# Patient Record
Sex: Female | Born: 1935 | Race: Black or African American | Hispanic: No | Smoking: Former smoker
Health system: Southern US, Community
[De-identification: ages and names within clinical notes are randomized; demographics above are authoritative.]

## PROBLEM LIST (undated history)

## (undated) DIAGNOSIS — I2510 Atherosclerotic heart disease of native coronary artery without angina pectoris: Secondary | ICD-10-CM

## (undated) DIAGNOSIS — N289 Disorder of kidney and ureter, unspecified: Secondary | ICD-10-CM

## (undated) DIAGNOSIS — Z955 Presence of coronary angioplasty implant and graft: Secondary | ICD-10-CM

## (undated) DIAGNOSIS — F0280 Dementia in other diseases classified elsewhere without behavioral disturbance: Secondary | ICD-10-CM

## (undated) DIAGNOSIS — I10 Essential (primary) hypertension: Secondary | ICD-10-CM

## (undated) DIAGNOSIS — I509 Heart failure, unspecified: Secondary | ICD-10-CM

## (undated) DIAGNOSIS — G309 Alzheimer's disease, unspecified: Secondary | ICD-10-CM

## (undated) DIAGNOSIS — F0390 Unspecified dementia without behavioral disturbance: Secondary | ICD-10-CM

## (undated) HISTORY — PX: COLON SURGERY: SHX602

## (undated) HISTORY — PX: CORONARY ANGIOPLASTY: SHX604

---

## 2004-07-28 DIAGNOSIS — Z955 Presence of coronary angioplasty implant and graft: Secondary | ICD-10-CM

## 2004-07-28 HISTORY — DX: Presence of coronary angioplasty implant and graft: Z95.5

## 2015-03-29 ENCOUNTER — Observation Stay
Admit: 2015-03-29 | Discharge: 2015-03-29 | Disposition: A | Discharge status: Home / Self Care | Payer: Medicare PPO | Attending: Internal Medicine | Admitting: Internal Medicine

## 2015-03-29 ENCOUNTER — Inpatient Hospital Stay
Admission: EM | Admit: 2015-03-29 | Discharge: 2015-03-31 | DRG: 291 | Disposition: A | Discharge status: Home Health | Payer: Medicare PPO | Attending: Internal Medicine | Admitting: Internal Medicine

## 2015-03-29 ENCOUNTER — Emergency Department: Payer: Medicare PPO

## 2015-03-29 ENCOUNTER — Encounter: Payer: Self-pay | Admitting: Emergency Medicine

## 2015-03-29 DIAGNOSIS — J9600 Acute respiratory failure, unspecified whether with hypoxia or hypercapnia: Secondary | ICD-10-CM | POA: Diagnosis present

## 2015-03-29 DIAGNOSIS — F0280 Dementia in other diseases classified elsewhere without behavioral disturbance: Secondary | ICD-10-CM | POA: Diagnosis present

## 2015-03-29 DIAGNOSIS — I2510 Atherosclerotic heart disease of native coronary artery without angina pectoris: Secondary | ICD-10-CM | POA: Diagnosis present

## 2015-03-29 DIAGNOSIS — R778 Other specified abnormalities of plasma proteins: Secondary | ICD-10-CM

## 2015-03-29 DIAGNOSIS — R748 Abnormal levels of other serum enzymes: Secondary | ICD-10-CM | POA: Diagnosis present

## 2015-03-29 DIAGNOSIS — Z955 Presence of coronary angioplasty implant and graft: Secondary | ICD-10-CM

## 2015-03-29 DIAGNOSIS — I5033 Acute on chronic diastolic (congestive) heart failure: Secondary | ICD-10-CM | POA: Diagnosis not present

## 2015-03-29 DIAGNOSIS — J90 Pleural effusion, not elsewhere classified: Secondary | ICD-10-CM

## 2015-03-29 DIAGNOSIS — Z87891 Personal history of nicotine dependence: Secondary | ICD-10-CM

## 2015-03-29 DIAGNOSIS — I129 Hypertensive chronic kidney disease with stage 1 through stage 4 chronic kidney disease, or unspecified chronic kidney disease: Secondary | ICD-10-CM | POA: Diagnosis present

## 2015-03-29 DIAGNOSIS — Z79899 Other long term (current) drug therapy: Secondary | ICD-10-CM

## 2015-03-29 DIAGNOSIS — R7989 Other specified abnormal findings of blood chemistry: Secondary | ICD-10-CM

## 2015-03-29 DIAGNOSIS — D61818 Other pancytopenia: Secondary | ICD-10-CM | POA: Diagnosis present

## 2015-03-29 DIAGNOSIS — N183 Chronic kidney disease, stage 3 (moderate): Secondary | ICD-10-CM | POA: Diagnosis present

## 2015-03-29 DIAGNOSIS — I4891 Unspecified atrial fibrillation: Secondary | ICD-10-CM | POA: Diagnosis present

## 2015-03-29 DIAGNOSIS — I493 Ventricular premature depolarization: Secondary | ICD-10-CM | POA: Diagnosis present

## 2015-03-29 DIAGNOSIS — I509 Heart failure, unspecified: Secondary | ICD-10-CM

## 2015-03-29 DIAGNOSIS — Z7982 Long term (current) use of aspirin: Secondary | ICD-10-CM

## 2015-03-29 DIAGNOSIS — G309 Alzheimer's disease, unspecified: Secondary | ICD-10-CM | POA: Diagnosis present

## 2015-03-29 HISTORY — DX: Heart failure, unspecified: I50.9

## 2015-03-29 HISTORY — DX: Presence of coronary angioplasty implant and graft: Z95.5

## 2015-03-29 HISTORY — DX: Disorder of kidney and ureter, unspecified: N28.9

## 2015-03-29 HISTORY — DX: Essential (primary) hypertension: I10

## 2015-03-29 HISTORY — DX: Atherosclerotic heart disease of native coronary artery without angina pectoris: I25.10

## 2015-03-29 HISTORY — DX: Unspecified dementia, unspecified severity, without behavioral disturbance, psychotic disturbance, mood disturbance, and anxiety: F03.90

## 2015-03-29 HISTORY — DX: Dementia in other diseases classified elsewhere, unspecified severity, without behavioral disturbance, psychotic disturbance, mood disturbance, and anxiety: F02.80

## 2015-03-29 HISTORY — DX: Alzheimer's disease, unspecified: G30.9

## 2015-03-29 LAB — CBC WITH DIFFERENTIAL/PLATELET
Basophils Absolute: 0 10*3/uL (ref 0–0.1)
Eosinophils Absolute: 0 10*3/uL (ref 0–0.7)
Eosinophils Relative: 1 %
HEMATOCRIT: 36.9 % (ref 35.0–47.0)
HEMOGLOBIN: 11.3 g/dL — AB (ref 12.0–16.0)
LYMPHS ABS: 0.6 10*3/uL — AB (ref 1.0–3.6)
MCH: 25.8 pg — ABNORMAL LOW (ref 26.0–34.0)
MCHC: 30.5 g/dL — AB (ref 32.0–36.0)
MCV: 84.6 fL (ref 80.0–100.0)
MONO ABS: 0.6 10*3/uL (ref 0.2–0.9)
NEUTROS ABS: 2 10*3/uL (ref 1.4–6.5)
Platelets: 96 10*3/uL — ABNORMAL LOW (ref 150–440)
RBC: 4.37 MIL/uL (ref 3.80–5.20)
RDW: 17.1 % — AB (ref 11.5–14.5)
WBC: 3.3 10*3/uL — ABNORMAL LOW (ref 3.6–11.0)

## 2015-03-29 LAB — COMPREHENSIVE METABOLIC PANEL
ALBUMIN: 4 g/dL (ref 3.5–5.0)
ALT: 28 U/L (ref 14–54)
AST: 45 U/L — AB (ref 15–41)
Alkaline Phosphatase: 85 U/L (ref 38–126)
Anion gap: 9 (ref 5–15)
BUN: 29 mg/dL — AB (ref 6–20)
CHLORIDE: 110 mmol/L (ref 101–111)
CO2: 25 mmol/L (ref 22–32)
CREATININE: 1.5 mg/dL — AB (ref 0.44–1.00)
Calcium: 9.6 mg/dL (ref 8.9–10.3)
GFR calc Af Amer: 34 mL/min — ABNORMAL LOW (ref >60)
GFR, EST NON AFRICAN AMERICAN: 29 mL/min — AB (ref >60)
GLUCOSE: 91 mg/dL (ref 65–99)
POTASSIUM: 3.5 mmol/L (ref 3.5–5.1)
SODIUM: 144 mmol/L (ref 135–145)
Total Bilirubin: 1.2 mg/dL (ref 0.3–1.2)
Total Protein: 8.7 g/dL — ABNORMAL HIGH (ref 6.5–8.1)

## 2015-03-29 LAB — BRAIN NATRIURETIC PEPTIDE: B Natriuretic Peptide: 1402 pg/mL — ABNORMAL HIGH (ref 0.0–100.0)

## 2015-03-29 LAB — TROPONIN I
TROPONIN I: 0.07 ng/mL — AB (ref <0.031)
Troponin I: 0.1 ng/mL — ABNORMAL HIGH (ref <0.031)

## 2015-03-29 LAB — MRSA PCR SCREENING: MRSA by PCR: NEGATIVE

## 2015-03-29 LAB — TSH: TSH: 3.903 u[IU]/mL (ref 0.350–4.500)

## 2015-03-29 MED ORDER — METOPROLOL TARTRATE 25 MG PO TABS
25.0000 mg | ORAL_TABLET | Freq: Two times a day (BID) | ORAL | Status: DC
  Administered 2015-03-31: 25 mg via ORAL
  Filled 2015-03-29 (×2): qty 1

## 2015-03-29 MED ORDER — ASPIRIN 81 MG PO CHEW
324.0000 mg | CHEWABLE_TABLET | Freq: Once | ORAL | Status: AC
  Administered 2015-03-29: 324 mg via ORAL
  Filled 2015-03-29: qty 4

## 2015-03-29 MED ORDER — ONDANSETRON HCL 4 MG PO TABS: 4.0000 mg | ORAL_TABLET | Freq: Four times a day (QID) | ORAL | Status: DC | PRN

## 2015-03-29 MED ORDER — LOSARTAN POTASSIUM 50 MG PO TABS
50.0000 mg | ORAL_TABLET | Freq: Every day | ORAL | Status: DC
  Administered 2015-03-31: 50 mg via ORAL
  Filled 2015-03-29 (×2): qty 1

## 2015-03-29 MED ORDER — ACETAMINOPHEN 325 MG PO TABS: 650.0000 mg | ORAL_TABLET | Freq: Four times a day (QID) | ORAL | Status: DC | PRN

## 2015-03-29 MED ORDER — HEPARIN SODIUM (PORCINE) 5000 UNIT/ML IJ SOLN
5000.0000 [IU] | Freq: Three times a day (TID) | INTRAMUSCULAR | Status: DC
  Administered 2015-03-29 – 2015-03-31 (×5): 5000 [IU] via SUBCUTANEOUS
  Filled 2015-03-29 (×5): qty 1

## 2015-03-29 MED ORDER — HYDRALAZINE HCL 20 MG/ML IJ SOLN: 10.0000 mg | INTRAMUSCULAR | Status: DC | PRN

## 2015-03-29 MED ORDER — ASPIRIN EC 81 MG PO TBEC
81.0000 mg | DELAYED_RELEASE_TABLET | Freq: Every day | ORAL | Status: DC
  Administered 2015-03-30 – 2015-03-31 (×2): 81 mg via ORAL
  Filled 2015-03-29 (×2): qty 1

## 2015-03-29 MED ORDER — SODIUM CHLORIDE 0.9 % IJ SOLN
3.0000 mL | Freq: Two times a day (BID) | INTRAMUSCULAR | Status: DC
Start: 2015-03-29 — End: 2015-03-31
  Administered 2015-03-29 – 2015-03-31 (×4): 3 mL via INTRAVENOUS

## 2015-03-29 MED ORDER — FUROSEMIDE 10 MG/ML IJ SOLN
40.0000 mg | Freq: Two times a day (BID) | INTRAMUSCULAR | Status: DC
  Administered 2015-03-30 – 2015-03-31 (×3): 40 mg via INTRAVENOUS
  Filled 2015-03-29 (×3): qty 4

## 2015-03-29 MED ORDER — POLYETHYLENE GLYCOL 3350 17 G PO PACK: 17.0000 g | PACK | Freq: Every day | ORAL | Status: DC | PRN

## 2015-03-29 MED ORDER — ACETAMINOPHEN 650 MG RE SUPP: 650.0000 mg | Freq: Four times a day (QID) | RECTAL | Status: DC | PRN

## 2015-03-29 MED ORDER — ONDANSETRON HCL 4 MG/2ML IJ SOLN: 4.0000 mg | Freq: Four times a day (QID) | INTRAMUSCULAR | Status: DC | PRN

## 2015-03-29 MED ORDER — FUROSEMIDE 10 MG/ML IJ SOLN
40.0000 mg | Freq: Once | INTRAMUSCULAR | Status: AC
  Administered 2015-03-29: 40 mg via INTRAVENOUS
  Filled 2015-03-29: qty 4

## 2015-03-29 MED ORDER — SPIRONOLACTONE 25 MG PO TABS
25.0000 mg | ORAL_TABLET | Freq: Every day | ORAL | Status: DC
  Administered 2015-03-30 – 2015-03-31 (×2): 25 mg via ORAL
  Filled 2015-03-29 (×2): qty 1

## 2015-03-29 NOTE — ED Notes (Signed)
Pt lives in [facility] facility and was brought in by EMS to be evaluated for congestive heart failure (per nurse practitioner).  Daily weights have been fluctuating and patient has bilateral lower extremity edema.

## 2015-03-29 NOTE — H&P (Signed)
[facility] [place] at [facility]   PATIENT NAME: Kimberly Acosta    MR#:  [id number]  DATE OF BIRTH:  1919-08-30  DATE OF ADMISSION:  03/29/2015  PRIMARY CARE PHYSICIAN: [facility]   REQUESTING/REFERRING PHYSICIAN: Dr. Darnelle Catalan  CHIEF COMPLAINT:   Chief Complaint  Patient presents with  . Leg Swelling    HISTORY OF PRESENT ILLNESS:  Kimberly Acosta  is a 81 y.o. female with a known history of coronary artery disease, congestive heart failure, atrial fibrillation not currently on anticoagulation, dementia who presents from [facility] due to increasing lower extremity edema and weight gain not responding to oral Lasix. On emergency room evaluation she is noted to have a slightly elevated troponin at 0.07 and hospitalist service was asked to admit for further evaluation. Her son provides the history as she is unable to do so due to dementia. He reports that recently she has been short of breath, no coughing or fevers. No recent syncopal events. She has gained about 10 pounds over the past 4 months.  PAST MEDICAL HISTORY:   Past Medical History  Diagnosis Date  . CHF (congestive heart failure)   . Hypertension   . Renal disorder     Chronic Kidney Disease  . Alzheimer disease   . Dementia   . S/P coronary artery stent placement 2006  . Coronary artery disease     PCI and stent to LAD in 2005    PAST SURGICAL HISTORY:   Past Surgical History  Procedure Laterality Date  . Coronary angioplasty      SOCIAL HISTORY:   Social History  Substance Use Topics  . Smoking status: Former Smoker    Quit date: 07/28/1978  . Smokeless tobacco: Not on file  . Alcohol Use: No    FAMILY HISTORY:  No family history on file. family history is unknown and the patient has dementia  DRUG ALLERGIES:  No Known Allergies  REVIEW OF SYSTEMS:   Review of Systems  Constitutional: Negative for fever, chills, weight loss  and malaise/fatigue.  HENT: Negative for congestion and hearing loss.   Eyes: Negative for blurred vision and pain.  Respiratory: Positive for shortness of breath. Negative for cough, hemoptysis, sputum production and stridor.   Cardiovascular: Positive for leg swelling. Negative for chest pain, palpitations and orthopnea.  Gastrointestinal: Negative for nausea, vomiting, abdominal pain, diarrhea, constipation and blood in stool.  Genitourinary: Negative for dysuria and frequency.  Musculoskeletal: Negative for myalgias, back pain, joint pain and neck pain.  Skin: Negative for rash.  Neurological: Negative for focal weakness, loss of consciousness and headaches.  Endo/Heme/Allergies: Does not bruise/bleed easily.  Psychiatric/Behavioral: Negative for depression and hallucinations. The patient is not nervous/anxious.     MEDICATIONS AT HOME:   Prior to Admission medications   Medication Sig Start Date End Date Taking? Authorizing Provider  aspirin EC 81 MG tablet Take 81 mg by mouth daily.   Yes Historical Provider, MD  furosemide (LASIX) 20 MG tablet Take 20 mg by mouth.   Yes Historical Provider, MD  furosemide (LASIX) 40 MG tablet Take 40 mg by mouth daily.   Yes Historical Provider, MD  losartan (COZAAR) 50 MG tablet Take 50 mg by mouth daily.   Yes Historical Provider, MD  metoprolol tartrate (LOPRESSOR) 25 MG tablet Take 25 mg by mouth 2 (two) times daily.   Yes Historical Provider, MD  spironolactone (ALDACTONE) 25 MG tablet Take 25 mg by mouth daily.  Yes Historical Provider, MD      VITAL SIGNS:  Blood pressure 149/82, pulse 120, resp. rate 12, height  (1.6 m), weight 74 kg (163 lb 2.3 oz), SpO2 62 %.  PHYSICAL EXAMINATION:  GENERAL:  81 y.o.-year-old patient lying in the bed with no acute distress.  EYES: Pupils equal, round, reactive to light and accommodation. No scleral icterus. Extraocular muscles intact.  HEENT: Head atraumatic, normocephalic. Oropharynx and  nasopharynx clear. Mucous membranes are moist NECK:  Supple, no jugular venous distention. No thyroid enlargement, no tenderness.  LUNGS: Decreased breath sounds over the right base, bibasilar crackles, no wheezing, no rhonchi. She is not in any respiratory distress. CARDIOVASCULAR: Irregular S1, S2 normal. No murmurs, rubs, or gallops.  ABDOMEN: Soft, nontender, nondistended. Bowel sounds present. No organomegaly or mass. No guarding no rebound EXTREMITIES: 2+ pedal edema extending to the thighs bilaterally, there seems to be some cyanosis of the fingertips, no clubbing.  NEUROLOGIC: Cranial nerves II through XII are grossly intact. Muscle strength 5/5 in all extremities. Sensation intact. Gait not checked.  PSYCHIATRIC: The patient is alert and oriented to person and place. Calm  SKIN: No obvious rash, lesion, or ulcer.   LABORATORY PANEL:   CBC  Recent Labs Lab 03/29/15 1547  WBC 3.3*  HGB 11.3*  HCT 36.9  PLT 96*   ------------------------------------------------------------------------------------------------------------------  Chemistries   Recent Labs Lab 03/29/15 1547  NA 144  K 3.5  CL 110  CO2 25  GLUCOSE 91  BUN 29*  CREATININE 1.50*  CALCIUM 9.6  AST 45*  ALT 28  ALKPHOS 85  BILITOT 1.2   ------------------------------------------------------------------------------------------------------------------  Cardiac Enzymes  Recent Labs Lab 03/29/15 1547  TROPONINI 0.07*   ------------------------------------------------------------------------------------------------------------------  RADIOLOGY:  Dg Chest Left Decubitus  03/29/2015   CLINICAL DATA:  Pleural effusion  EXAM: CHEST - LEFT DECUBITUS  COMPARISON:  03/29/2015  FINDINGS: Trace right pleural fluid or thickening noted with obscuration of the right lung base. There is a probable element of at least partial right lower lobe collapse. Curvilinear right mid lung zone atelectasis or possible loculation  of fluid within the minor fissure.  IMPRESSION: Small right pleural fluid or thickening as above.   Electronically Signed   By: Christiana Pellant M.D.   On: 03/29/2015 16:44   Dg Chest Portable 1 View  03/29/2015   CLINICAL DATA:  Congestive heart failure.  Lower extremity edema.  EXAM: PORTABLE CHEST - 1 VIEW  COMPARISON:  None.  FINDINGS: Elevated right hemidiaphragm with blunting of the right costophrenic angle and bandlike opacity medially at the right lung base.  Mild enlargement of the cardiopericardial silhouette with questionable retrocardiac density on the left.  Dense atherosclerotic calcification of the aortic arch.  IMPRESSION: 1. Apparent elevation of the right hemidiaphragm with at least a small right pleural effusion, and right basilar atelectasis. Subpulmonic effusion not excluded. 2. Indistinct density along the left hemidiaphragm which could reflect atelectasis or pneumonia. 3. Cardiomegaly. 4. Dense atherosclerotic calcification of the aortic arch.   Electronically Signed   By: Gaylyn Rong M.D.   On: 03/29/2015 15:44    EKG:   Orders placed or performed during the hospital encounter of 03/29/15  . EKG 12-Lead  . EKG 12-Lead    IMPRESSION AND PLAN:   #1 acute on chronic diastolic congestive heart failure exacerbation: She has gained 10 pounds over the past 4 months, has been short of breath and has bilateral lower extremity edema to the thighs. She has received 40  mg of IV Lasix in the emergency room. We'll continue with 40 IV twice a day. I have opted not to place a Foley catheter as this would be uncomfortable for the patient. Will check daily weights, maintain low-sodium diet. Monitor BNP which is currently 1402. At this point will not consult cardiology. Her primary cardiologist is Dr. Gwen Pounds.  #2 elevated troponin: This is a mild elevation in this most likely due to congestive heart failure. We'll cycle cardiac enzymes. Monitor on telemetry. No signs of ischemia on  EKG.  #3 atrial fibrillation: Rate is currently controlled. Continue metoprolol at home dose. She is not on anticoagulation due to fall risk. Continue aspirin 81 mg.  #4 chronic kidney disease stage III: I do not have a baseline creatinine for her. Currently her GFR is 34. We'll continue to monitor as we diurese. Continue losartan and spironolactone for now.  #5 dementia: Seems to be stable. Her son is present is her power of attorney's name is Kyree Fedorko. He reports that she is at her baseline mental status at this time. She is oriented to person and place. He does not have a history of behavioral disturbance  #6 abnormal chest x-ray findings: Small right-sided pleural effusion with right lower lobe collapse, atelectasis at the left base. We will start with incentive spirometry and diuresis. Repeat chest x-ray in the morning for better evaluation.  All the records are reviewed and case discussed with ED provider. Management plans discussed with the patient, and her son Giavanna Kang.  CODE STATUS: Full code. This was discussed with [name] on admission.  TOTAL TIME TAKING CARE OF THIS PATIENT: 45 minutes.  Greater than 50% of time spent in coordination of care and counseling.  Elby Showers M.D on 03/29/2015 at 6:23 PM  Between 7am to 6pm - Pager - [phone]  After 6pm go to [email or web address] - password [email or web address] [facility]  [facility]  Office  [phone]  CC: Primary care physician; [facility]

## 2015-03-29 NOTE — ED Provider Notes (Signed)
[facility] Emergency Department Provider Note  ____________________________________________  Time seen: Approximately 3:13 PM  I have reviewed the triage vital signs and the nursing notes.   HISTORY  Chief Complaint Leg Swelling    HPI Kimberly Acosta is a 81 y.o. female who comes from nursing home patient's son reports patient's gained about 10 pounds in the last 4-6 months. He is worried about congestive failure being worse. Patient had one extra dose of Lasix last weekend. Patient herself who the son reports never complains much. Denies any chest pain shortness of breath increase in leg swelling or any other problems. Patient's past medical history is bel   Past Medical History  Diagnosis Date  . CHF (congestive heart failure)   . Hypertension   . Renal disorder     Chronic Kidney Disease  . Alzheimer disease   . Dementia   . Coronary artery disease   . S/P coronary artery stent placement 2006    There are no active problems to display for this patient.   History reviewed. No pertinent past surgical history.  Current Outpatient Rx  Name  Route  Sig  Dispense  Refill  . aspirin EC 81 MG tablet   Oral   Take 81 mg by mouth daily.         . furosemide (LASIX) 20 MG tablet   Oral   Take 20 mg by mouth.         . furosemide (LASIX) 40 MG tablet   Oral   Take 40 mg by mouth daily.         Marland Kitchen losartan (COZAAR) 50 MG tablet   Oral   Take 50 mg by mouth daily.         . metoprolol tartrate (LOPRESSOR) 25 MG tablet   Oral   Take 25 mg by mouth 2 (two) times daily.         Marland Kitchen spironolactone (ALDACTONE) 25 MG tablet   Oral   Take 25 mg by mouth daily.           Allergies Review of patient's allergies indicates no known allergies.  No family history on file.  Social History Social History  Substance Use Topics  . Smoking status: Former Smoker    Quit date: 07/28/1978  . Smokeless tobacco: None  . Alcohol Use: No     Review of Systems Constitutional: No fever/chills Eyes: No visual changes. ENT: No sore throat. Cardiovascular: Denies chest pain. Respiratory: Denies shortness of breath. Gastrointestinal: No abdominal pain.  No nausea, no vomiting.  No diarrhea.  No constipation. Genitourinary: Negative for dysuria. Musculoskeletal: Negative for back pain. Skin: Negative for rash. Neurological: Negative for headaches, focal weakness or numbness.  10-point ROS otherwise negative.  ____________________________________________   PHYSICAL EXAM:  VITAL SIGNS: ED Triage Vitals  Enc Vitals Group     BP 03/29/15 1402 140/90 mmHg     Pulse Rate 03/29/15 1402 54     Resp --      Temp --      Temp src --      SpO2 --      Weight 03/29/15 1402 163 lb 2.3 oz (74 kg)     Height 03/29/15 1402 5\' 3"  (1.6 m)     Head Cir --      Peak Flow --      Pain Score --      Pain Loc --      Pain Edu? --  Excl. in GC? --     Constitutional: Alert and oriented. Well appearing and in no acute distress. Eyes: Conjunctivae are normal. PERRL. EOMI. Head: Atraumatic. Nose: No congestion/rhinnorhea. Mouth/Throat: Mucous membranes are moist.  Oropharynx non-erythematous. Neck: No stridor. No JVD  Cardiovascular: Normal rate, regular rhythm. Grossly normal heart sounds.  Good peripheral circulation. Respiratory: Normal respiratory effort.  No retractions. Lungs CTAB. Gastrointestinal: Soft and nontender. No distention. No abdominal bruits. No CVA tenderness. Musculoskeletal: No lower extremity tendernessthere is 1+ edema to the knees.  No joint effusions. Neurologic:  Normal speech and language. No gross focal neurologic deficits are appreciated. No gait instability. Skin:  Skin is warm, dry and intact. No rash noted. Psychiatric: Mood and affect are normal. Speech and behavior are normal.  ____________________________________________   LABS (all labs ordered are listed, but only abnormal results are  displayed)  Labs Reviewed  COMPREHENSIVE METABOLIC PANEL - Abnormal; Notable for the following:    BUN 29 (*)    Creatinine, Ser 1.50 (*)    Total Protein 8.7 (*)    AST 45 (*)    GFR calc non Af Amer 29 (*)    GFR calc Af Amer 34 (*)    All other components within normal limits  TROPONIN I - Abnormal; Notable for the following:    Troponin I 0.07 (*)    All other components within normal limits  BRAIN NATRIURETIC PEPTIDE - Abnormal; Notable for the following:    B Natriuretic Peptide 1402.0 (*)    All other components within normal limits  CBC WITH DIFFERENTIAL/PLATELET - Abnormal; Notable for the following:    WBC 3.3 (*)    Hemoglobin 11.3 (*)    MCH 25.8 (*)    MCHC 30.5 (*)    RDW 17.1 (*)    Platelets 96 (*)    Lymphs Abs 0.6 (*)    All other components within normal limits   ____________________________________________  EKG  EKG read and interpreted by me shows what appears to be slow A. fib with PVCs. There are flipped T's inferiorly and laterally which may represent ischemia there are no old EKGs to compare with      ____________________________________________  RADIOLOGY chest x-ray shows an effusion. Radiologist read it I ____________________________________________   PROCEDURES    ____________________________________________   INITIAL IMPRESSION / ASSESSMENT AND PLAN / ED COURSE  Pertinent labs & imaging results that were available during my care of the patient were reviewed by me and considered in my medical decision making (see chart for details).  Troponin is elevated somewhat. Patient reports was elevated when she was in [place] last time he is familiar with the concept of the heart being restrained by the congestive failure and troponin going up and coming back down again so apparently that is what happened at [facility] probably what is going on now. However since the troponin is elevated patient has a pleural effusion and her weight has gone up from  140 260 I will give her some extra Lasix and see if we can watch her in the hospital overnight. ____________________________________________   FINAL CLINICAL IMPRESSION(S) / ED DIAGNOSES  Final diagnoses:  Acute on chronic congestive heart failure, unspecified congestive heart failure type  Troponin level elevated      Arnaldo Natal, MD 03/29/15 1746

## 2015-03-29 NOTE — Progress Notes (Signed)
MD, Dr. Anne Hahn notified due to BP/HR.  HR has been bouncing between mid 50-60's.  Metoprolol ordered.  MD ordered to hold medication, will place order for prn hydralazine with BP parameters.  Will continue to monitor. Louis Meckel

## 2015-03-29 NOTE — Progress Notes (Signed)
*  PRELIMINARY RESULTS* Echocardiogram 2D Echocardiogram has been performed.  Garrel Ridgel Stills 03/29/2015, 9:12 PM

## 2015-03-29 NOTE — ED Notes (Signed)
Pt's o2sat in 80s with good waveform.  Pt placed on 2L o2 Lane.

## 2015-03-30 ENCOUNTER — Observation Stay: Payer: Medicare PPO

## 2015-03-30 DIAGNOSIS — G309 Alzheimer's disease, unspecified: Secondary | ICD-10-CM | POA: Diagnosis present

## 2015-03-30 DIAGNOSIS — N183 Chronic kidney disease, stage 3 (moderate): Secondary | ICD-10-CM | POA: Diagnosis present

## 2015-03-30 DIAGNOSIS — Z79899 Other long term (current) drug therapy: Secondary | ICD-10-CM | POA: Diagnosis not present

## 2015-03-30 DIAGNOSIS — D61818 Other pancytopenia: Secondary | ICD-10-CM | POA: Diagnosis present

## 2015-03-30 DIAGNOSIS — Z955 Presence of coronary angioplasty implant and graft: Secondary | ICD-10-CM | POA: Diagnosis not present

## 2015-03-30 DIAGNOSIS — I493 Ventricular premature depolarization: Secondary | ICD-10-CM | POA: Diagnosis present

## 2015-03-30 DIAGNOSIS — F0280 Dementia in other diseases classified elsewhere without behavioral disturbance: Secondary | ICD-10-CM | POA: Diagnosis present

## 2015-03-30 DIAGNOSIS — J90 Pleural effusion, not elsewhere classified: Secondary | ICD-10-CM | POA: Diagnosis present

## 2015-03-30 DIAGNOSIS — I2510 Atherosclerotic heart disease of native coronary artery without angina pectoris: Secondary | ICD-10-CM | POA: Diagnosis present

## 2015-03-30 DIAGNOSIS — Z7982 Long term (current) use of aspirin: Secondary | ICD-10-CM | POA: Diagnosis not present

## 2015-03-30 DIAGNOSIS — Z87891 Personal history of nicotine dependence: Secondary | ICD-10-CM | POA: Diagnosis not present

## 2015-03-30 DIAGNOSIS — I129 Hypertensive chronic kidney disease with stage 1 through stage 4 chronic kidney disease, or unspecified chronic kidney disease: Secondary | ICD-10-CM | POA: Diagnosis present

## 2015-03-30 DIAGNOSIS — I4891 Unspecified atrial fibrillation: Secondary | ICD-10-CM | POA: Diagnosis present

## 2015-03-30 DIAGNOSIS — R748 Abnormal levels of other serum enzymes: Secondary | ICD-10-CM | POA: Diagnosis present

## 2015-03-30 DIAGNOSIS — J9600 Acute respiratory failure, unspecified whether with hypoxia or hypercapnia: Secondary | ICD-10-CM | POA: Diagnosis present

## 2015-03-30 DIAGNOSIS — I5033 Acute on chronic diastolic (congestive) heart failure: Secondary | ICD-10-CM | POA: Diagnosis present

## 2015-03-30 LAB — BASIC METABOLIC PANEL
Anion gap: 7 (ref 5–15)
BUN: 30 mg/dL — AB (ref 6–20)
CHLORIDE: 113 mmol/L — AB (ref 101–111)
CO2: 27 mmol/L (ref 22–32)
Calcium: 9.1 mg/dL (ref 8.9–10.3)
Creatinine, Ser: 1.53 mg/dL — ABNORMAL HIGH (ref 0.44–1.00)
GFR calc Af Amer: 33 mL/min — ABNORMAL LOW (ref >60)
GFR calc non Af Amer: 28 mL/min — ABNORMAL LOW (ref >60)
GLUCOSE: 87 mg/dL (ref 65–99)
POTASSIUM: 3.5 mmol/L (ref 3.5–5.1)
Sodium: 147 mmol/L — ABNORMAL HIGH (ref 135–145)

## 2015-03-30 LAB — TROPONIN I
Troponin I: 0.1 ng/mL — ABNORMAL HIGH (ref <0.031)
Troponin I: 0.08 ng/mL — ABNORMAL HIGH (ref <0.031)

## 2015-03-30 LAB — CBC
HEMATOCRIT: 33.9 % — AB (ref 35.0–47.0)
HEMOGLOBIN: 10.4 g/dL — AB (ref 12.0–16.0)
MCH: 25.9 pg — AB (ref 26.0–34.0)
MCHC: 30.7 g/dL — ABNORMAL LOW (ref 32.0–36.0)
MCV: 84.4 fL (ref 80.0–100.0)
Platelets: 85 10*3/uL — ABNORMAL LOW (ref 150–440)
RBC: 4.01 MIL/uL (ref 3.80–5.20)
RDW: 17.4 % — ABNORMAL HIGH (ref 11.5–14.5)
WBC: 2.7 10*3/uL — ABNORMAL LOW (ref 3.6–11.0)

## 2015-03-30 LAB — BRAIN NATRIURETIC PEPTIDE: B Natriuretic Peptide: 1393 pg/mL — ABNORMAL HIGH (ref 0.0–100.0)

## 2015-03-30 NOTE — Care Management (Signed)
Spoke with patient and her son [name].  Discussed that if able to return directly to the assisted living facility, would benefit from home health nursing.  Also discussed that unit staff will assess for the need of home 02 prior to discharge.  Heads up referral for nursing has been made to [place]

## 2015-03-30 NOTE — Progress Notes (Signed)
MD, Dr. Elisabeth Pigeon notified due to pts low BP/HR in 50's.  Metoprolol scheduled.  MD ordered to hold dose.  Will continue to monitor. Louis Meckel

## 2015-03-30 NOTE — Clinical Social Work Note (Signed)
CSW was later able to confirm with Hazle at [facility] that pt should be able to DC back to [facility] over the weekend as long as she does not have any medication changes.

## 2015-03-30 NOTE — Progress Notes (Signed)
Initial Nutrition Assessment     INTERVENTION:  Meals and snacks: Cater to pt prefernces Nutrition diet education: Son at bedside and familiar with diet restrictions   NUTRITION DIAGNOSIS:    (None at this time) related to   as evidenced by  .    GOAL:   Patient will meet greater than or equal to 90% of their needs    MONITOR:    (Energy intake, Anthropometric, Electrolyte and renal profile)  REASON FOR ASSESSMENT:   Diagnosis    ASSESSMENT:      Pt admitted with CHF exacerbation, elevated troponin  Past Medical History  Diagnosis Date  . CHF (congestive heart failure)   . Hypertension   . Renal disorder     Chronic Kidney Disease  . Alzheimer disease   . Dementia   . S/P coronary artery stent placement 2006  . Coronary artery disease     PCI and stent to LAD in 2005    Current Nutrition: eating lunch during visit.  Son at bedside and pt with dementia and unable to provide reliable information  Food/Nutrition-Related History: Son reports good intake prior to admission.  "Nothing is wrong with her appetite."    Medications: lasix, miralax  Electrolyte/Renal Profile and Glucose Profile:   Recent Labs Lab 03/29/15 1547 03/30/15 0416  NA 144 147*  K 3.5 3.5  CL 110 113*  CO2 25 27  BUN 29* 30*  CREATININE 1.50* 1.53*  CALCIUM 9.6 9.1  GLUCOSE 91 87   Protein Profile:  Recent Labs Lab 03/29/15 1547  ALBUMIN 4.0    Gastrointestinal Profile: Last BM: 8/31   Nutrition-Focused Physical Exam Findings: Nutrition-Focused physical exam completed. Findings are no fat depletion, normal to mild/moderate muscle depletion, and mild edema.      Weight Change: wt gain secondary to fluid.  Son confirms no wt loss     Diet Order:  Diet 2 gram sodium Room service appropriate?: Yes; Fluid consistency:: Thin  Skin:   reviewed     Height:   Ht Readings from Last 1 Encounters:  03/29/15  (1.6 m)    Weight:   Wt Readings from Last 1  Encounters:  03/30/15 147 lb 6.4 oz (66.86 kg)     BMI:  Body mass index is 26.12 kg/(m^2).   EDUCATION NEEDS:   No education needs identified at this time  LOW Care Level  Troi Florendo B. Freida Busman, RD, LDN [phone] (pager)

## 2015-03-30 NOTE — Progress Notes (Signed)
Lab notified RN of troponin 0.10, troponin prior was same value; MD, Dr. Betti Cruz notified. One more troponin ordered.  Continue to monitor. Kimberly Acosta

## 2015-03-30 NOTE — Progress Notes (Signed)
[facility] - [place] at [facility]   PATIENT NAME: Kimberly Acosta    MR#:  [id number]  DATE OF BIRTH:  [birth year]  SUBJECTIVE:  CHIEF COMPLAINT:   Chief Complaint  Patient presents with  . Leg Swelling   Patient has no concerns. Poor historian due to dementia. REVIEW OF SYSTEMS:    Review of Systems  Unable to perform ROS: dementia      DRUG ALLERGIES:  No Known Allergies  VITALS:  Blood pressure 118/43, pulse 59, temperature 97.4 F (36.3 C), temperature source Oral, resp. rate 17, height 5\' 3"  (1.6 m), weight 66.86 kg (147 lb 6.4 oz), SpO2 97 %.  PHYSICAL EXAMINATION:   Physical Exam  GENERAL:  81 y.o.-year-old patient lying in the bed with no acute distress.  EYES: Pupils equal, round, reactive to light and accommodation. No scleral icterus. Extraocular muscles intact.  HEENT: Head atraumatic, normocephalic. Oropharynx and nasopharynx clear.  NECK:  Supple, no jugular venous distention. No thyroid enlargement, no tenderness.  LUNGS: Normal work of breathing. Decreased air entry on the right side. No wheezing.  CARDIOVASCULAR: S1, S2 normal. No murmurs, rubs, or gallops.  ABDOMEN: Soft, nontender, nondistended. Bowel sounds present. No organomegaly or mass.  EXTREMITIES: No cyanosis, clubbing or edema b/l.    NEUROLOGIC: Cranial nerves II through XII are intact. No focal Motor or sensory deficits b/l.   PSYCHIATRIC: The patient is alert and awake but confused SKIN: No obvious rash, lesion, or ulcer.    LABORATORY PANEL:   CBC  Recent Labs Lab 03/30/15 0416  WBC 2.7*  HGB 10.4*  HCT 33.9*  PLT 85*   ------------------------------------------------------------------------------------------------------------------  Chemistries   Recent Labs Lab 03/29/15 1547 03/30/15 0416  NA 144 147*  K 3.5 3.5  CL 110 113*  CO2 25 27  GLUCOSE 91 87  BUN 29* 30*  CREATININE 1.50* 1.53*  CALCIUM 9.6 9.1  AST 45*  --   ALT 28  --    ALKPHOS 85  --   BILITOT 1.2  --    ------------------------------------------------------------------------------------------------------------------  Cardiac Enzymes  Recent Labs Lab 03/30/15 1009  TROPONINI 0.08*   ------------------------------------------------------------------------------------------------------------------  RADIOLOGY:  Dg Chest 2 View  03/30/2015   CLINICAL DATA:  Pleural effusion.  Unresponsive patient.  EXAM: CHEST  2 VIEW  COMPARISON:  03/29/2015  FINDINGS: Cardiomediastinal silhouette is enlarged. Mediastinal contours appear intact. The aorta is torturous and contains atherosclerotic calcifications of the notch. There is bilateral hilar fullness seen on the lateral view.  There is no evidence of pneumothorax. Again noted is elevation of the right hemidiaphragm and linear opacity at the right cardio phrenic angle. There is opacification of the left lower lung, which near completely obscures the right hemidiaphragm.  Osseous structures are without acute abnormality. Soft tissues are grossly normal.  IMPRESSION: Persistent left lower lobe airspace consolidation.  Probable moderate-sized right pleural effusion with atelectasis or airspace disease within the right lower lobe.  Atherosclerosis of the aorta.  Bilateral hilar fullness, which may represent lymphadenopathy versus enlargement of the pulmonary arteries.   Electronically Signed   By: Ted Mcalpine M.D.   On: 03/30/2015 07:16   Ct Chest Wo Contrast  03/30/2015   CLINICAL DATA:  Abn CXR. Patient presents from [facility] with shortness of breath, lower extremity edema and 10 pound weight gain over last 4 months. Admitted with CHF. There is a documented 02 sat of 91 on 2 liters. Receiving IV Lasix and some supplemental 02 support.  Patient has decreased breath sounds according to her RN. NKI. No hx CA. Hx Sp pci and stent of lad 2005.  EXAM: CT CHEST WITHOUT CONTRAST  TECHNIQUE: Multidetector CT  imaging of the chest was performed following the standard protocol without IV contrast.  COMPARISON:  Radiograph from earlier the same day  FINDINGS: Moderate right and trace left pleural effusions. No pericardial effusion. Four-chamber cardiac enlargement. Coronary and aortic calcifications. No definite hilar or mediastinal adenopathy, sensitivity decreased without IV contrast.  Dependent atelectasis in the right lower lobe. Right middle lobe atelectasis and volume loss. No evident central obstructing mass or lesion. Calcified granuloma, anterior right lower lobe.  Minimal thoracic spondylitic changes. Sternum intact. Dental restorations and caries. Probable 14 mm cyst in hepatic segment 4, incompletely characterized. Remainder visualized upper abdomen grossly unremarkable.  IMPRESSION: 1. Right middle lobe atelectasis. 2. Moderate right and trace left pleural effusions. 3. Atherosclerosis, including aortic and coronary artery disease. Please note that although the presence of coronary artery calcium documents the presence of coronary artery disease, the severity of this disease and any potential stenosis cannot be assessed on this non-gated CT examination. Assessment for potential risk factor modification, dietary therapy or pharmacologic therapy may be warranted, if clinically indicated.   Electronically Signed   By: Corlis Leak M.D.   On: 03/30/2015 09:33   Dg Chest Left Decubitus  03/29/2015   CLINICAL DATA:  Pleural effusion  EXAM: CHEST - LEFT DECUBITUS  COMPARISON:  03/29/2015  FINDINGS: Trace right pleural fluid or thickening noted with obscuration of the right lung base. There is a probable element of at least partial right lower lobe collapse. Curvilinear right mid lung zone atelectasis or possible loculation of fluid within the minor fissure.  IMPRESSION: Small right pleural fluid or thickening as above.   Electronically Signed   By: Christiana Pellant M.D.   On: 03/29/2015 16:44   Dg Chest Portable 1  View  03/29/2015   CLINICAL DATA:  Congestive heart failure.  Lower extremity edema.  EXAM: PORTABLE CHEST - 1 VIEW  COMPARISON:  None.  FINDINGS: Elevated right hemidiaphragm with blunting of the right costophrenic angle and bandlike opacity medially at the right lung base.  Mild enlargement of the cardiopericardial silhouette with questionable retrocardiac density on the left.  Dense atherosclerotic calcification of the aortic arch.  IMPRESSION: 1. Apparent elevation of the right hemidiaphragm with at least a small right pleural effusion, and right basilar atelectasis. Subpulmonic effusion not excluded. 2. Indistinct density along the left hemidiaphragm which could reflect atelectasis or pneumonia. 3. Cardiomegaly. 4. Dense atherosclerotic calcification of the aortic arch.   Electronically Signed   By: Gaylyn Rong M.D.   On: 03/29/2015 15:44     ASSESSMENT AND PLAN:   #1 acute on chronic diastolic congestive heart failure exacerbation: With acute respiratory failure  She has gained 10 pounds over the past 4 months CT scan of the chest ordered today shows bilateral pleural effusions. Continue IV Lasix while monitoring input output and creatinine.  #2 elevated troponin: This is a mild elevation in this most likely due to congestive heart failure.  Repeat troponin is stable.  #3 atrial fibrillation: Rate is currently controlled. Continue metoprolol at home dose. She is not on anticoagulation due to fall risk. Continue aspirin 81 mg.  #4 chronic kidney disease stage III: I do not have a baseline creatinine for her. Currently her GFR is 34. We'll continue to monitor as we diurese. Continue losartan and spironolactone for now.  #  5 dementia: Seems to be stable. Her son is present is her power of attorney's name is Kimberly Acosta. Watch for any inpatient delirium.  #6 DVT prophylaxis with heparin  # Pancytopenia is chronic   All the records are reviewed and case discussed with Care  Management/Social Workerr. Management plans discussed with the patient, family and they are in agreement.  CODE STATUS: FULL  TOTAL TIME TAKING CARE OF THIS PATIENT: 40 minutes.   POSSIBLE D/C IN 1-2 DAYS, DEPENDING ON CLINICAL CONDITION.   Milagros Loll R M.D on 03/30/2015 at 11:42 AM  Between 7am to 6pm - Pager - [phone]  After 6pm go to [email or web address] - password [email or web address] [facility]  [place] [facility]  Office  [phone]  CC: Primary care physician; [facility]

## 2015-03-30 NOTE — Care Management (Addendum)
Troponins are mildly elevated - up to .10.  Patient presents from [facility] with shortness of breath, lower extremity edema and 10 pound weight gain over last 4 months.  Admitted with CHF.  There is a documented 02 sat of 91 on 2 liters.  Receiving IV Lasix and some supplemental 02 support.  If not already receiving home health nursing, will recommend this to the attending.  Patient does have dementia

## 2015-03-30 NOTE — Clinical Social Work Note (Signed)
Clinical Social Work Assessment  Patient Details  Name: Kimberly Acosta MRN: [id number] Date of Birth: 28-Nov-1919  Date of referral:  03/30/15               Reason for consult:  Facility Placement (She is a resident at [facility])                Permission sought to share information with:  Family Supports, Oceanographer granted to share information::  Yes, Verbal Permission Granted  Name::     Ed Son 336 [phone]  Agency::  [facility]  Relationship::     Contact Information:     Housing/Transportation Living arrangements for the past 2 months:  Assisted Dealer of Information:  Adult Children, Facility Patient Interpreter Needed:  None Criminal Activity/Legal Involvement Pertinent to Current Situation/Hospitalization:  No - Comment as needed Significant Relationships:  Adult Children Lives with:  Facility Resident Do you feel safe going back to the place where you live?  Yes Need for family participation in patient care:  Yes (Comment)  Care giving concerns:  No concerns expressed at this time   Office manager / plan:  CSW spoke to pt.   She was not oriented enough to participate in assessment,  CSW spoke to pt's son who was in the room feeding pt at time of assessment.  He confirmed that she was a resident at [facility].  He also confirmed that pt used a rolaid walker when ambulating.  Facility also confirmed this.  CSW notified facility that pt may DC back to them over the weekend.  Facility stated this was fine.  CSW also contacted [facility] and left vm with this information.   Employment status:  Retired, Disabled (Comment on whether or not currently receiving Disability) Insurance information:  Medicare PT Recommendations:    Information / Referral to community resources:     Patient/Family's Response to care:  Pt's son was in agreement with DC back to ALF.  Patient/Family's Understanding of and Emotional Response  to Diagnosis, Current Treatment, and Prognosis:    Pt's son was in agreement with DC back to ALF.  Pt's son verbalized his understanding of this DC plan Emotional Assessment Appearance:  Appears stated age Attitude/Demeanor/Rapport:   (pleasently confused) Affect (typically observed):   (pleasently confused ) Orientation:  Oriented to Self Alcohol / Substance use:  Never Used Psych involvement (Current and /or in the community):  No (Comment)  Discharge Needs  Concerns to be addressed:  Care Coordination Readmission within the last 30 days:  No Current discharge risk:  Physical Impairment Barriers to Discharge:  No Barriers Identified   Chauncy Passy, LCSW 03/30/2015, 2:24 PM

## 2015-03-31 LAB — BASIC METABOLIC PANEL
Anion gap: 7 (ref 5–15)
BUN: 32 mg/dL — AB (ref 6–20)
CHLORIDE: 110 mmol/L (ref 101–111)
CO2: 26 mmol/L (ref 22–32)
CREATININE: 1.68 mg/dL — AB (ref 0.44–1.00)
Calcium: 8.6 mg/dL — ABNORMAL LOW (ref 8.9–10.3)
GFR calc non Af Amer: 25 mL/min — ABNORMAL LOW (ref >60)
GFR, EST AFRICAN AMERICAN: 29 mL/min — AB (ref >60)
Glucose, Bld: 86 mg/dL (ref 65–99)
POTASSIUM: 3.4 mmol/L — AB (ref 3.5–5.1)
Sodium: 143 mmol/L (ref 135–145)

## 2015-03-31 LAB — PLATELET COUNT: Platelets: 83 10*3/uL — ABNORMAL LOW (ref 150–440)

## 2015-03-31 MED ORDER — FUROSEMIDE 40 MG PO TABS: 40.0000 mg | ORAL_TABLET | Freq: Every day | ORAL | Status: DC

## 2015-03-31 MED ORDER — FUROSEMIDE 10 MG/ML IJ SOLN: 20.0000 mg | Freq: Two times a day (BID) | INTRAMUSCULAR | Status: DC

## 2015-03-31 MED ORDER — FUROSEMIDE 20 MG PO TABS: 20.0000 mg | ORAL_TABLET | Freq: Every day | ORAL | Status: DC

## 2015-03-31 NOTE — Progress Notes (Signed)
Sat at rest on RA 96%. Patient ambulated with walker in hallway, no respiratory distress noted sat 98 on ra with exertion [employer]

## 2015-03-31 NOTE — Discharge Instructions (Signed)
Fluid restriction up to 1200 ml daily Low salt diet. Daily weigh your self, if > 2 Lb gain in a day or > 5 Lb in 1 week- increase the evening dose of lasix from 20 mg to 40 mg- for 2-3 days and check kidney function - and still not able to get rid of extra weight- call your doctor or cardiologist.

## 2015-03-31 NOTE — Progress Notes (Signed)
Patient is medically stable for D/C back to [facility] today. Per [employer] at [facility] patient can return today. Clinical Child psychotherapist (CSW) prepared D/C packet, updated FL2 with medications and faxed D/C Summary to [facility]. RN will call report to [facility]. Patient's son [name] is at bedside and aware of above. Son will provide transport in private vehicle. Please reconsult if future social work needs arise. CSW signing off.   Jetta Lout, LCSWA [phone]

## 2015-03-31 NOTE — Discharge Summary (Signed)
[facility] - [place] at [facility]   PATIENT NAME: Kimberly Acosta    MR#:  [id number]  DATE OF BIRTH:  1919-12-21  DATE OF ADMISSION:  03/29/2015 ADMITTING PHYSICIAN: Gale Journey, MD  DATE OF DISCHARGE: 03/31/2015  PRIMARY CARE PHYSICIAN: [facility]    ADMISSION DIAGNOSIS:  Pleural effusion [J90] Troponin level elevated [R79.89] Acute on chronic congestive heart failure, unspecified congestive heart failure type [I50.9]  DISCHARGE DIAGNOSIS:  Active Problems:   CHF, acute on chronic   SECONDARY DIAGNOSIS:   Past Medical History  Diagnosis Date  . CHF (congestive heart failure)   . Hypertension   . Renal disorder     Chronic Kidney Disease  . Alzheimer disease   . Dementia   . S/P coronary artery stent placement 2006  . Coronary artery disease     PCI and stent to LAD in 2005    HOSPITAL COURSE:  #1 acute on chronic diastolic congestive heart failure exacerbation: With acute respiratory failure  She has gained 10 pounds over the past 4 months CT scan of the chest ordered today shows bilateral pleural effusions. Continue IV Lasix while monitoring input output and creatinine. Creatinin is rerasonablly stgable. edema improved significantly.  #2 elevated troponin: This is a mild elevation in this most likely due to congestive heart failure.  Repeat troponin is stable.  #3 atrial fibrillation: Rate is currently controlled. Continue metoprolol at home dose. She is not on anticoagulation due to fall risk. Continue aspirin 81 mg.  #4 chronic kidney disease stage III: I do not have a baseline creatinine for her. Currently her GFR is 34. We'll continue to monitor as we diurese. Continue losartan and spironolactone for now.  #5 dementia: Seems to be stable. Her son is present is her power of attorney's name is Jacquette Canales.   #6 DVT prophylaxis with heparin  # Pancytopenia is chronic   DISCHARGE CONDITIONS:    stable  CONSULTS OBTAINED:     DRUG ALLERGIES:  No Known Allergies  DISCHARGE MEDICATIONS:   Current Discharge Medication List    START taking these medications   Details  !! furosemide (LASIX) 20 MG tablet Take 1 tablet (20 mg total) by mouth daily. Qty: 30 tablet, Refills: 0    !! furosemide (LASIX) 40 MG tablet Take 1 tablet (40 mg total) by mouth daily. Qty: 30 tablet, Refills: 0     !! - Potential duplicate medications found. Please discuss with provider.    CONTINUE these medications which have NOT CHANGED   Details  aspirin EC 81 MG tablet Take 81 mg by mouth daily.    losartan (COZAAR) 50 MG tablet Take 50 mg by mouth daily.    metoprolol tartrate (LOPRESSOR) 25 MG tablet Take 25 mg by mouth 2 (two) times daily.    spironolactone (ALDACTONE) 25 MG tablet Take 25 mg by mouth daily.         DISCHARGE INSTRUCTIONS:    Fluid restriction up to 1200 ml daily Low salt diet. Daily weigh your self, if > 2 Lb gain in a day or > 5 Lb in 1 week- increase evening dose of lasix from 20 to 40 mgfor 2 days- and still not able to get rid of extra weight- call your doctor or cardiologist.    If you experience worsening of your admission symptoms, develop shortness of breath, life threatening emergency, suicidal or homicidal thoughts you must seek medical attention immediately by calling 911 or calling your  MD immediately  if symptoms less severe.  You Must read complete instructions/literature along with all the possible adverse reactions/side effects for all the Medicines you take and that have been prescribed to you. Take any new Medicines after you have completely understood and accept all the possible adverse reactions/side effects.   Please note  You were cared for by a hospitalist during your hospital stay. If you have any questions about your discharge medications or the care you received while you were in the hospital after you are discharged, you can call the  unit and asked to speak with the hospitalist on call if the hospitalist that took care of you is not available. Once you are discharged, your primary care physician will handle any further medical issues. Please note that NO REFILLS for any discharge medications will be authorized once you are discharged, as it is imperative that you return to your primary care physician (or establish a relationship with a primary care physician if you do not have one) for your aftercare needs so that they can reassess your need for medications and monitor your lab values.    Today   CHIEF COMPLAINT:   Chief Complaint  Patient presents with  . Leg Swelling    HISTORY OF PRESENT ILLNESS:  Kimberly Acosta  is a 81 y.o. female with a known history of coronary artery disease, congestive heart failure, atrial fibrillation not currently on anticoagulation, dementia who presents [facility] due to increasing lower extremity edema and weight gain not responding to oral Lasix. On emergency room evaluation she is noted to have a slightly elevated troponin at 0.07 and hospitalist service was asked to admit for further evaluation. Her son provides the history as she is unable to do so due to dementia. He reports that recently she has been short of breath, no coughing or fevers. No recent syncopal events. She has gained about 10 pounds over the past 4 months.   VITAL SIGNS:  Blood pressure 127/59, pulse 68, temperature 97.5 F (36.4 C), temperature source Oral, resp. rate 22, height 5\' 3"  (1.6 m), weight 66.4 kg (146 lb 6.2 oz), SpO2 94 %.  I/O:   Intake/Output Summary (Last 24 hours) at 03/31/15 1141 Last data filed at 03/31/15 1013  Gross per 24 hour  Intake      0 ml  Output      0 ml  Net      0 ml    PHYSICAL EXAMINATION:   GENERAL: 81 y.o.-year-old patient lying in the bed with no acute distress.  EYES: Pupils equal, round, reactive to light and accommodation. No scleral icterus.  Extraocular muscles intact.  HEENT: Head atraumatic, normocephalic. Oropharynx and nasopharynx clear.  NECK: Supple, no jugular venous distention. No thyroid enlargement, no tenderness.  LUNGS: Normal work of breathing. No wheezing.  CARDIOVASCULAR: S1, S2 normal. No murmurs, rubs, or gallops.  ABDOMEN: Soft, nontender, nondistended. Bowel sounds present. No organomegaly or mass.  EXTREMITIES: No cyanosis, clubbing or edema b/l.  NEUROLOGIC: Cranial nerves II through XII are intact. No focal Motor or sensory deficits b/l.  PSYCHIATRIC: The patient is alert and awake but confused SKIN: No obvious rash, lesion, or ulcer.   DATA REVIEW:   CBC  Recent Labs Lab 03/30/15 0416 03/31/15 0416  WBC 2.7*  --   HGB 10.4*  --   HCT 33.9*  --   PLT 85* 83*    Chemistries   Recent Labs Lab 03/29/15 1547  03/31/15  0416  NA 144  < > 143  K 3.5  < > 3.4*  CL 110  < > 110  CO2 25  < > 26  GLUCOSE 91  < > 86  BUN 29*  < > 32*  CREATININE 1.50*  < > 1.68*  CALCIUM 9.6  < > 8.6*  AST 45*  --   --   ALT 28  --   --   ALKPHOS 85  --   --   BILITOT 1.2  --   --   < > = values in this interval not displayed.  Cardiac Enzymes  Recent Labs Lab 03/30/15 1009  TROPONINI 0.08*    Microbiology Results  Results for orders placed or performed during the hospital encounter of 03/29/15  MRSA PCR Screening     Status: None   Collection Time: 03/29/15  9:51 PM  Result Value Ref Range Status   MRSA by PCR NEGATIVE NEGATIVE Final    Comment:        The GeneXpert MRSA Assay (FDA approved for NASAL specimens only), is one component of a comprehensive MRSA colonization surveillance program. It is not intended to diagnose MRSA infection nor to guide or monitor treatment for MRSA infections.     RADIOLOGY:  Dg Chest 2 View  03/30/2015   CLINICAL DATA:  Pleural effusion.  Unresponsive patient.  EXAM: CHEST  2 VIEW  COMPARISON:  03/29/2015  FINDINGS: Cardiomediastinal silhouette  is enlarged. Mediastinal contours appear intact. The aorta is torturous and contains atherosclerotic calcifications of the notch. There is bilateral hilar fullness seen on the lateral view.  There is no evidence of pneumothorax. Again noted is elevation of the right hemidiaphragm and linear opacity at the right cardio phrenic angle. There is opacification of the left lower lung, which near completely obscures the right hemidiaphragm.  Osseous structures are without acute abnormality. Soft tissues are grossly normal.  IMPRESSION: Persistent left lower lobe airspace consolidation.  Probable moderate-sized right pleural effusion with atelectasis or airspace disease within the right lower lobe.  Atherosclerosis of the aorta.  Bilateral hilar fullness, which may represent lymphadenopathy versus enlargement of the pulmonary arteries.   Electronically Signed   By: Ted Mcalpine M.D.   On: 03/30/2015 07:16   Ct Chest Wo Contrast  03/30/2015   CLINICAL DATA:  Abn CXR. Patient presents from [facility] with shortness of breath, lower extremity edema and 10 pound weight gain over last 4 months. Admitted with CHF. There is a documented 02 sat of 91 on 2 liters. Receiving IV Lasix and some supplemental 02 support. Patient has decreased breath sounds according to her RN. NKI. No hx CA. Hx Sp pci and stent of lad 2005.  EXAM: CT CHEST WITHOUT CONTRAST  TECHNIQUE: Multidetector CT imaging of the chest was performed following the standard protocol without IV contrast.  COMPARISON:  Radiograph from earlier the same day  FINDINGS: Moderate right and trace left pleural effusions. No pericardial effusion. Four-chamber cardiac enlargement. Coronary and aortic calcifications. No definite hilar or mediastinal adenopathy, sensitivity decreased without IV contrast.  Dependent atelectasis in the right lower lobe. Right middle lobe atelectasis and volume loss. No evident central obstructing mass or lesion. Calcified  granuloma, anterior right lower lobe.  Minimal thoracic spondylitic changes. Sternum intact. Dental restorations and caries. Probable 14 mm cyst in hepatic segment 4, incompletely characterized. Remainder visualized upper abdomen grossly unremarkable.  IMPRESSION: 1. Right middle lobe atelectasis. 2. Moderate right and trace left pleural  effusions. 3. Atherosclerosis, including aortic and coronary artery disease. Please note that although the presence of coronary artery calcium documents the presence of coronary artery disease, the severity of this disease and any potential stenosis cannot be assessed on this non-gated CT examination. Assessment for potential risk factor modification, dietary therapy or pharmacologic therapy may be warranted, if clinically indicated.   Electronically Signed   By: Corlis Leak M.D.   On: 03/30/2015 09:33   Dg Chest Left Decubitus  03/29/2015   CLINICAL DATA:  Pleural effusion  EXAM: CHEST - LEFT DECUBITUS  COMPARISON:  03/29/2015  FINDINGS: Trace right pleural fluid or thickening noted with obscuration of the right lung base. There is a probable element of at least partial right lower lobe collapse. Curvilinear right mid lung zone atelectasis or possible loculation of fluid within the minor fissure.  IMPRESSION: Small right pleural fluid or thickening as above.   Electronically Signed   By: Christiana Pellant M.D.   On: 03/29/2015 16:44   Dg Chest Portable 1 View  03/29/2015   CLINICAL DATA:  Congestive heart failure.  Lower extremity edema.  EXAM: PORTABLE CHEST - 1 VIEW  COMPARISON:  None.  FINDINGS: Elevated right hemidiaphragm with blunting of the right costophrenic angle and bandlike opacity medially at the right lung base.  Mild enlargement of the cardiopericardial silhouette with questionable retrocardiac density on the left.  Dense atherosclerotic calcification of the aortic arch.  IMPRESSION: 1. Apparent elevation of the right hemidiaphragm with at least a small right pleural  effusion, and right basilar atelectasis. Subpulmonic effusion not excluded. 2. Indistinct density along the left hemidiaphragm which could reflect atelectasis or pneumonia. 3. Cardiomegaly. 4. Dense atherosclerotic calcification of the aortic arch.   Electronically Signed   By: Gaylyn Rong M.D.   On: 03/29/2015 15:44    EKG:   Orders placed or performed during the hospital encounter of 03/29/15  . EKG 12-Lead  . EKG 12-Lead  . EKG 12-Lead  . EKG 12-Lead      Management plans discussed with the patient, family and they are in agreement.  CODE STATUS:     Code Status Orders        Start     Ordered   03/29/15 2007  Full code   Continuous     03/29/15 2006    Advance Directive Documentation        Most Recent Value   Type of Advance Directive  Healthcare Power of Attorney   Pre-existing out of facility DNR order (yellow form or pink MOST form)     "MOST" Form in Place?        TOTAL TIME TAKING CARE OF THIS PATIENT: 35 minutes.    Altamese Dilling M.D on 03/31/2015 at 11:41 AM  Between 7am to 6pm - Pager - [phone]  After 6pm go to [email or web address] - password [email or web address]  [facility] [place] [facility]  Office  [phone]  CC: Primary care physician; [facility]

## 2015-03-31 NOTE — Care Management Note (Signed)
Case Management Note  Patient Details  Name: Kimberly Acosta MRN: [id number] Date of Birth: 1919/09/14  Subjective/Objective:     Request for a Home Health RN faxed to [facility] with note that [name] resides at [facility] assisted living.                Action/Plan:   Expected Discharge Date:                  Expected Discharge Plan:     In-House Referral:     Discharge planning Services     Post Acute Care Choice:    Choice offered to:     DME Arranged:    DME Agency:     HH Arranged:    HH Agency:     Status of Service:     Medicare Important Message Given:    Date Medicare IM Given:    Medicare IM give by:    Date Additional Medicare IM Given:    Additional Medicare Important Message give by:     If discussed at Long Length of Stay Meetings, dates discussed:    Additional Comments:  Lashe Oliveira A, RN 03/31/2015, 1:16 PM

## 2015-03-31 NOTE — Progress Notes (Signed)
Report called to Kimberly Acosta at [facility]. Patient status / discharge gone over with nurse. Patient will be discharged on RA. No distress noted. Son at bedside to transport patient to facility. Packet given to son and instructed to give patient to staff at [facility]. Jerzy Roepke [employer]

## 2015-03-31 NOTE — Progress Notes (Signed)
[facility] - [place] at [facility]   PATIENT NAME: Kimberly Acosta    MR#:  [id number]  DATE OF BIRTH:  Feb 23, 1920  SUBJECTIVE:  CHIEF COMPLAINT:   Chief Complaint  Patient presents with  . Leg Swelling   Patient has no concerns. Poor historian due to dementia. Son is in room- he says- she looks great- her swelling is gone, and he is very happy by her improvement. REVIEW OF SYSTEMS:    Review of Systems  Unable to perform ROS: dementia    DRUG ALLERGIES:  No Known Allergies  VITALS:  Blood pressure 127/59, pulse 68, temperature 97.5 F (36.4 C), temperature source Oral, resp. rate 22, height 5\' 3"  (1.6 m), weight 66.4 kg (146 lb 6.2 oz), SpO2 94 %.  PHYSICAL EXAMINATION:   Physical Exam  GENERAL:  81 y.o.-year-old patient lying in the bed with no acute distress.  EYES: Pupils equal, round, reactive to light and accommodation. No scleral icterus. Extraocular muscles intact.  HEENT: Head atraumatic, normocephalic. Oropharynx and nasopharynx clear.  NECK:  Supple, no jugular venous distention. No thyroid enlargement, no tenderness.  LUNGS: Normal work of breathing.  No wheezing.  CARDIOVASCULAR: S1, S2 normal. No murmurs, rubs, or gallops.  ABDOMEN: Soft, nontender, nondistended. Bowel sounds present. No organomegaly or mass.  EXTREMITIES: No cyanosis, clubbing or edema b/l.    NEUROLOGIC: Cranial nerves II through XII are intact. No focal Motor or sensory deficits b/l.   PSYCHIATRIC: The patient is alert and awake but confused SKIN: No obvious rash, lesion, or ulcer.    LABORATORY PANEL:   CBC  Recent Labs Lab 03/30/15 0416 03/31/15 0416  WBC 2.7*  --   HGB 10.4*  --   HCT 33.9*  --   PLT 85* 83*   ------------------------------------------------------------------------------------------------------------------  Chemistries   Recent Labs Lab 03/29/15 1547  03/31/15 0416  NA 144  < > 143  K 3.5  < > 3.4*  CL 110  < > 110   CO2 25  < > 26  GLUCOSE 91  < > 86  BUN 29*  < > 32*  CREATININE 1.50*  < > 1.68*  CALCIUM 9.6  < > 8.6*  AST 45*  --   --   ALT 28  --   --   ALKPHOS 85  --   --   BILITOT 1.2  --   --   < > = values in this interval not displayed. ------------------------------------------------------------------------------------------------------------------  Cardiac Enzymes  Recent Labs Lab 03/30/15 1009  TROPONINI 0.08*   ------------------------------------------------------------------------------------------------------------------  RADIOLOGY:  Dg Chest 2 View  03/30/2015   CLINICAL DATA:  Pleural effusion.  Unresponsive patient.  EXAM: CHEST  2 VIEW  COMPARISON:  03/29/2015  FINDINGS: Cardiomediastinal silhouette is enlarged. Mediastinal contours appear intact. The aorta is torturous and contains atherosclerotic calcifications of the notch. There is bilateral hilar fullness seen on the lateral view.  There is no evidence of pneumothorax. Again noted is elevation of the right hemidiaphragm and linear opacity at the right cardio phrenic angle. There is opacification of the left lower lung, which near completely obscures the right hemidiaphragm.  Osseous structures are without acute abnormality. Soft tissues are grossly normal.  IMPRESSION: Persistent left lower lobe airspace consolidation.  Probable moderate-sized right pleural effusion with atelectasis or airspace disease within the right lower lobe.  Atherosclerosis of the aorta.  Bilateral hilar fullness, which may represent lymphadenopathy versus enlargement of the pulmonary arteries.   Electronically Signed  By: Ted Mcalpine M.D.   On: 03/30/2015 07:16   Ct Chest Wo Contrast  03/30/2015   CLINICAL DATA:  Abn CXR. Patient presents from [facility] with shortness of breath, lower extremity edema and 10 pound weight gain over last 4 months. Admitted with CHF. There is a documented 02 sat of 91 on 2 liters. Receiving IV  Lasix and some supplemental 02 support. Patient has decreased breath sounds according to her RN. NKI. No hx CA. Hx Sp pci and stent of lad 2005.  EXAM: CT CHEST WITHOUT CONTRAST  TECHNIQUE: Multidetector CT imaging of the chest was performed following the standard protocol without IV contrast.  COMPARISON:  Radiograph from earlier the same day  FINDINGS: Moderate right and trace left pleural effusions. No pericardial effusion. Four-chamber cardiac enlargement. Coronary and aortic calcifications. No definite hilar or mediastinal adenopathy, sensitivity decreased without IV contrast.  Dependent atelectasis in the right lower lobe. Right middle lobe atelectasis and volume loss. No evident central obstructing mass or lesion. Calcified granuloma, anterior right lower lobe.  Minimal thoracic spondylitic changes. Sternum intact. Dental restorations and caries. Probable 14 mm cyst in hepatic segment 4, incompletely characterized. Remainder visualized upper abdomen grossly unremarkable.  IMPRESSION: 1. Right middle lobe atelectasis. 2. Moderate right and trace left pleural effusions. 3. Atherosclerosis, including aortic and coronary artery disease. Please note that although the presence of coronary artery calcium documents the presence of coronary artery disease, the severity of this disease and any potential stenosis cannot be assessed on this non-gated CT examination. Assessment for potential risk factor modification, dietary therapy or pharmacologic therapy may be warranted, if clinically indicated.   Electronically Signed   By: Corlis Leak M.D.   On: 03/30/2015 09:33   Dg Chest Left Decubitus  03/29/2015   CLINICAL DATA:  Pleural effusion  EXAM: CHEST - LEFT DECUBITUS  COMPARISON:  03/29/2015  FINDINGS: Trace right pleural fluid or thickening noted with obscuration of the right lung base. There is a probable element of at least partial right lower lobe collapse. Curvilinear right mid lung zone atelectasis or possible  loculation of fluid within the minor fissure.  IMPRESSION: Small right pleural fluid or thickening as above.   Electronically Signed   By: Christiana Pellant M.D.   On: 03/29/2015 16:44   Dg Chest Portable 1 View  03/29/2015   CLINICAL DATA:  Congestive heart failure.  Lower extremity edema.  EXAM: PORTABLE CHEST - 1 VIEW  COMPARISON:  None.  FINDINGS: Elevated right hemidiaphragm with blunting of the right costophrenic angle and bandlike opacity medially at the right lung base.  Mild enlargement of the cardiopericardial silhouette with questionable retrocardiac density on the left.  Dense atherosclerotic calcification of the aortic arch.  IMPRESSION: 1. Apparent elevation of the right hemidiaphragm with at least a small right pleural effusion, and right basilar atelectasis. Subpulmonic effusion not excluded. 2. Indistinct density along the left hemidiaphragm which could reflect atelectasis or pneumonia. 3. Cardiomegaly. 4. Dense atherosclerotic calcification of the aortic arch.   Electronically Signed   By: Gaylyn Rong M.D.   On: 03/29/2015 15:44     ASSESSMENT AND PLAN:   #1 acute on chronic diastolic congestive heart failure exacerbation: With acute respiratory failure  She has gained 10 pounds over the past 4 months CT scan of the chest ordered today shows bilateral pleural effusions. Continue IV Lasix while monitoring input output and creatinine. Creatinin is rerasonablly stgable.  edema improved significantly.  #2 elevated troponin: This  is a mild elevation in this most likely due to congestive heart failure.  Repeat troponin is stable.  #3 atrial fibrillation: Rate is currently controlled. Continue metoprolol at home dose. She is not on anticoagulation due to fall risk. Continue aspirin 81 mg.  #4 chronic kidney disease stage III: I do not have a baseline creatinine for her. Currently her GFR is 34. We'll continue to monitor as we diurese. Continue losartan and spironolactone for  now.  #5 dementia: Seems to be stable. Her son is present is her power of attorney's name is Kyleah P[place]abene.   #6 DVT prophylaxis with heparin  # Pancytopenia is chronic  Discharge back to NH today. All the records are reviewed and case discussed with Care Management/Social Workerr. Management plans discussed with the patient, family and they are in agreement.  CODE STATUS: FULL  TOTAL TIME TAKING CARE OF THIS PATIENT: 40 minutes.    Altamese Dilling M.D on 03/31/2015 at 11:32 AM  Between 7am to 6pm - Pager - [phone]  After 6pm go to [email or web address] - password [email or web address] [facility]  [place] [facility]  Office  [phone]  CC: Primary care physician; [facility]

## 2015-04-03 ENCOUNTER — Other Ambulatory Visit
Admission: RE | Admit: 2015-04-03 | Discharge: 2015-04-03 | Disposition: A | Discharge status: Home / Self Care | Payer: Medicare PPO | Source: Ambulatory Visit | Attending: Nurse Practitioner | Admitting: Nurse Practitioner

## 2015-04-03 DIAGNOSIS — Z0000 Encounter for general adult medical examination without abnormal findings: Secondary | ICD-10-CM | POA: Insufficient documentation

## 2015-04-03 LAB — COMPREHENSIVE METABOLIC PANEL
ALBUMIN: 3.7 g/dL (ref 3.5–5.0)
ALK PHOS: 86 U/L (ref 38–126)
ALT: 22 U/L (ref 14–54)
ANION GAP: 10 (ref 5–15)
AST: 35 U/L (ref 15–41)
BUN: 29 mg/dL — ABNORMAL HIGH (ref 6–20)
CALCIUM: 9.4 mg/dL (ref 8.9–10.3)
CHLORIDE: 107 mmol/L (ref 101–111)
CO2: 27 mmol/L (ref 22–32)
CREATININE: 1.36 mg/dL — AB (ref 0.44–1.00)
GFR calc Af Amer: 38 mL/min — ABNORMAL LOW (ref >60)
GFR calc non Af Amer: 33 mL/min — ABNORMAL LOW (ref >60)
GLUCOSE: 95 mg/dL (ref 65–99)
Potassium: 3.5 mmol/L (ref 3.5–5.1)
SODIUM: 144 mmol/L (ref 135–145)
Total Bilirubin: 1 mg/dL (ref 0.3–1.2)
Total Protein: 8.2 g/dL — ABNORMAL HIGH (ref 6.5–8.1)

## 2015-04-03 LAB — CBC
HCT: 35.3 % (ref 35.0–47.0)
HEMOGLOBIN: 10.9 g/dL — AB (ref 12.0–16.0)
MCH: 26.3 pg (ref 26.0–34.0)
MCHC: 31 g/dL — ABNORMAL LOW (ref 32.0–36.0)
MCV: 84.8 fL (ref 80.0–100.0)
PLATELETS: 106 10*3/uL — AB (ref 150–440)
RBC: 4.17 MIL/uL (ref 3.80–5.20)
RDW: 17.7 % — ABNORMAL HIGH (ref 11.5–14.5)
WBC: 3.8 10*3/uL (ref 3.6–11.0)

## 2015-04-03 LAB — TSH: TSH: 3.133 u[IU]/mL (ref 0.350–4.500)

## 2015-04-13 ENCOUNTER — Other Ambulatory Visit
Admission: RE | Admit: 2015-04-13 | Discharge: 2015-04-13 | Disposition: A | Discharge status: Home / Self Care | Payer: Medicare PPO | Source: Ambulatory Visit | Attending: Nurse Practitioner | Admitting: Nurse Practitioner

## 2015-04-13 DIAGNOSIS — Z0289 Encounter for other administrative examinations: Secondary | ICD-10-CM | POA: Diagnosis present

## 2015-04-13 LAB — COMPREHENSIVE METABOLIC PANEL
ALK PHOS: 72 U/L (ref 38–126)
ALT: 19 U/L (ref 14–54)
ANION GAP: 11 (ref 5–15)
AST: 32 U/L (ref 15–41)
Albumin: 3.9 g/dL (ref 3.5–5.0)
BILIRUBIN TOTAL: 1 mg/dL (ref 0.3–1.2)
BUN: 31 mg/dL — ABNORMAL HIGH (ref 6–20)
CALCIUM: 9.2 mg/dL (ref 8.9–10.3)
CO2: 28 mmol/L (ref 22–32)
CREATININE: 1.62 mg/dL — AB (ref 0.44–1.00)
Chloride: 104 mmol/L (ref 101–111)
GFR, EST AFRICAN AMERICAN: 31 mL/min — AB (ref >60)
GFR, EST NON AFRICAN AMERICAN: 26 mL/min — AB (ref >60)
Glucose, Bld: 112 mg/dL — ABNORMAL HIGH (ref 65–99)
Potassium: 3.5 mmol/L (ref 3.5–5.1)
Sodium: 143 mmol/L (ref 135–145)
TOTAL PROTEIN: 8.4 g/dL — AB (ref 6.5–8.1)

## 2015-05-11 ENCOUNTER — Other Ambulatory Visit
Admission: RE | Admit: 2015-05-11 | Discharge: 2015-05-11 | Disposition: A | Discharge status: Home / Self Care | Payer: Medicare PPO | Source: Ambulatory Visit | Attending: Nurse Practitioner | Admitting: Nurse Practitioner

## 2015-05-11 DIAGNOSIS — Z029 Encounter for administrative examinations, unspecified: Secondary | ICD-10-CM | POA: Insufficient documentation

## 2015-05-11 LAB — COMPREHENSIVE METABOLIC PANEL
ALBUMIN: 3.7 g/dL (ref 3.5–5.0)
ALT: 21 U/L (ref 14–54)
AST: 34 U/L (ref 15–41)
Alkaline Phosphatase: 86 U/L (ref 38–126)
Anion gap: 11 (ref 5–15)
BUN: 33 mg/dL — AB (ref 6–20)
CHLORIDE: 109 mmol/L (ref 101–111)
CO2: 27 mmol/L (ref 22–32)
Calcium: 9.5 mg/dL (ref 8.9–10.3)
Creatinine, Ser: 1.6 mg/dL — ABNORMAL HIGH (ref 0.44–1.00)
GFR calc Af Amer: 31 mL/min — ABNORMAL LOW (ref >60)
GFR calc non Af Amer: 27 mL/min — ABNORMAL LOW (ref >60)
GLUCOSE: 96 mg/dL (ref 65–99)
POTASSIUM: 3.7 mmol/L (ref 3.5–5.1)
Sodium: 147 mmol/L — ABNORMAL HIGH (ref 135–145)
Total Bilirubin: 1.3 mg/dL — ABNORMAL HIGH (ref 0.3–1.2)
Total Protein: 8.4 g/dL — ABNORMAL HIGH (ref 6.5–8.1)

## 2015-05-11 LAB — CBC
HEMATOCRIT: 35.8 % (ref 35.0–47.0)
Hemoglobin: 11 g/dL — ABNORMAL LOW (ref 12.0–16.0)
MCH: 26.2 pg (ref 26.0–34.0)
MCHC: 30.8 g/dL — AB (ref 32.0–36.0)
MCV: 85.1 fL (ref 80.0–100.0)
Platelets: 85 10*3/uL — ABNORMAL LOW (ref 150–440)
RBC: 4.21 MIL/uL (ref 3.80–5.20)
RDW: 19.6 % — AB (ref 11.5–14.5)
WBC: 3.4 10*3/uL — ABNORMAL LOW (ref 3.6–11.0)

## 2015-05-17 ENCOUNTER — Other Ambulatory Visit
Admission: RE | Admit: 2015-05-17 | Discharge: 2015-05-17 | Disposition: A | Discharge status: Home / Self Care | Payer: Medicare PPO | Source: Ambulatory Visit | Attending: Nurse Practitioner | Admitting: Nurse Practitioner

## 2015-05-17 DIAGNOSIS — Z029 Encounter for administrative examinations, unspecified: Secondary | ICD-10-CM | POA: Diagnosis present

## 2015-05-17 LAB — COMPREHENSIVE METABOLIC PANEL
ALT: 25 U/L (ref 14–54)
AST: 43 U/L — AB (ref 15–41)
Albumin: 3.8 g/dL (ref 3.5–5.0)
Alkaline Phosphatase: 94 U/L (ref 38–126)
Anion gap: 11 (ref 5–15)
BUN: 34 mg/dL — AB (ref 6–20)
CHLORIDE: 108 mmol/L (ref 101–111)
CO2: 27 mmol/L (ref 22–32)
Calcium: 10 mg/dL (ref 8.9–10.3)
Creatinine, Ser: 1.64 mg/dL — ABNORMAL HIGH (ref 0.44–1.00)
GFR calc Af Amer: 30 mL/min — ABNORMAL LOW (ref >60)
GFR, EST NON AFRICAN AMERICAN: 26 mL/min — AB (ref >60)
Glucose, Bld: 90 mg/dL (ref 65–99)
POTASSIUM: 3.5 mmol/L (ref 3.5–5.1)
SODIUM: 146 mmol/L — AB (ref 135–145)
Total Bilirubin: 1.3 mg/dL — ABNORMAL HIGH (ref 0.3–1.2)
Total Protein: 8.5 g/dL — ABNORMAL HIGH (ref 6.5–8.1)

## 2015-05-17 LAB — CBC
HEMATOCRIT: 37.2 % (ref 35.0–47.0)
Hemoglobin: 11.4 g/dL — ABNORMAL LOW (ref 12.0–16.0)
MCH: 26.1 pg (ref 26.0–34.0)
MCHC: 30.6 g/dL — ABNORMAL LOW (ref 32.0–36.0)
MCV: 85.2 fL (ref 80.0–100.0)
Platelets: 79 10*3/uL — ABNORMAL LOW (ref 150–440)
RBC: 4.37 MIL/uL (ref 3.80–5.20)
RDW: 20.1 % — AB (ref 11.5–14.5)
WBC: 3.9 10*3/uL (ref 3.6–11.0)

## 2015-05-21 ENCOUNTER — Other Ambulatory Visit
Admission: RE | Admit: 2015-05-21 | Discharge: 2015-05-21 | Disposition: A | Discharge status: Home / Self Care | Payer: Medicare PPO | Source: Ambulatory Visit | Attending: Nurse Practitioner | Admitting: Nurse Practitioner

## 2015-05-21 DIAGNOSIS — Z029 Encounter for administrative examinations, unspecified: Secondary | ICD-10-CM | POA: Insufficient documentation

## 2015-05-21 LAB — BASIC METABOLIC PANEL
Anion gap: 11 (ref 5–15)
BUN: 37 mg/dL — AB (ref 6–20)
CALCIUM: 9.7 mg/dL (ref 8.9–10.3)
CO2: 27 mmol/L (ref 22–32)
CREATININE: 1.45 mg/dL — AB (ref 0.44–1.00)
Chloride: 107 mmol/L (ref 101–111)
GFR calc Af Amer: 35 mL/min — ABNORMAL LOW (ref >60)
GFR, EST NON AFRICAN AMERICAN: 30 mL/min — AB (ref >60)
Glucose, Bld: 92 mg/dL (ref 65–99)
Potassium: 3.9 mmol/L (ref 3.5–5.1)
SODIUM: 145 mmol/L (ref 135–145)

## 2015-06-14 ENCOUNTER — Emergency Department

## 2015-06-14 ENCOUNTER — Encounter: Payer: Self-pay | Admitting: *Deleted

## 2015-06-14 ENCOUNTER — Inpatient Hospital Stay
Admission: EM | Admit: 2015-06-14 | Discharge: 2015-06-26 | DRG: 682 | Disposition: A | Discharge status: Skilled Nursing Facility | Attending: Internal Medicine | Admitting: Internal Medicine

## 2015-06-14 DIAGNOSIS — N179 Acute kidney failure, unspecified: Secondary | ICD-10-CM | POA: Diagnosis not present

## 2015-06-14 DIAGNOSIS — Z7982 Long term (current) use of aspirin: Secondary | ICD-10-CM | POA: Diagnosis not present

## 2015-06-14 DIAGNOSIS — R4182 Altered mental status, unspecified: Secondary | ICD-10-CM

## 2015-06-14 DIAGNOSIS — D696 Thrombocytopenia, unspecified: Secondary | ICD-10-CM | POA: Diagnosis present

## 2015-06-14 DIAGNOSIS — N183 Chronic kidney disease, stage 3 (moderate): Secondary | ICD-10-CM | POA: Diagnosis present

## 2015-06-14 DIAGNOSIS — I2510 Atherosclerotic heart disease of native coronary artery without angina pectoris: Secondary | ICD-10-CM | POA: Diagnosis present

## 2015-06-14 DIAGNOSIS — Z87891 Personal history of nicotine dependence: Secondary | ICD-10-CM | POA: Diagnosis not present

## 2015-06-14 DIAGNOSIS — G9341 Metabolic encephalopathy: Secondary | ICD-10-CM | POA: Diagnosis present

## 2015-06-14 DIAGNOSIS — E870 Hyperosmolality and hypernatremia: Secondary | ICD-10-CM | POA: Diagnosis not present

## 2015-06-14 DIAGNOSIS — I5042 Chronic combined systolic (congestive) and diastolic (congestive) heart failure: Secondary | ICD-10-CM | POA: Diagnosis present

## 2015-06-14 DIAGNOSIS — F0280 Dementia in other diseases classified elsewhere without behavioral disturbance: Secondary | ICD-10-CM | POA: Diagnosis present

## 2015-06-14 DIAGNOSIS — R0989 Other specified symptoms and signs involving the circulatory and respiratory systems: Secondary | ICD-10-CM

## 2015-06-14 DIAGNOSIS — Z955 Presence of coronary angioplasty implant and graft: Secondary | ICD-10-CM | POA: Diagnosis not present

## 2015-06-14 DIAGNOSIS — R627 Adult failure to thrive: Secondary | ICD-10-CM | POA: Diagnosis present

## 2015-06-14 DIAGNOSIS — E860 Dehydration: Secondary | ICD-10-CM | POA: Diagnosis not present

## 2015-06-14 DIAGNOSIS — G309 Alzheimer's disease, unspecified: Secondary | ICD-10-CM | POA: Diagnosis present

## 2015-06-14 DIAGNOSIS — Z820 Family history of epilepsy and other diseases of the nervous system: Secondary | ICD-10-CM

## 2015-06-14 DIAGNOSIS — J9611 Chronic respiratory failure with hypoxia: Secondary | ICD-10-CM | POA: Diagnosis present

## 2015-06-14 DIAGNOSIS — I081 Rheumatic disorders of both mitral and tricuspid valves: Secondary | ICD-10-CM | POA: Diagnosis present

## 2015-06-14 DIAGNOSIS — I4891 Unspecified atrial fibrillation: Secondary | ICD-10-CM | POA: Diagnosis present

## 2015-06-14 DIAGNOSIS — N170 Acute kidney failure with tubular necrosis: Secondary | ICD-10-CM | POA: Diagnosis not present

## 2015-06-14 DIAGNOSIS — Z9981 Dependence on supplemental oxygen: Secondary | ICD-10-CM | POA: Diagnosis not present

## 2015-06-14 DIAGNOSIS — Z515 Encounter for palliative care: Secondary | ICD-10-CM | POA: Diagnosis present

## 2015-06-14 DIAGNOSIS — I129 Hypertensive chronic kidney disease with stage 1 through stage 4 chronic kidney disease, or unspecified chronic kidney disease: Secondary | ICD-10-CM | POA: Diagnosis present

## 2015-06-14 DIAGNOSIS — E878 Other disorders of electrolyte and fluid balance, not elsewhere classified: Secondary | ICD-10-CM | POA: Diagnosis present

## 2015-06-14 LAB — COMPREHENSIVE METABOLIC PANEL
ALK PHOS: 102 U/L (ref 38–126)
ALT: 47 U/L (ref 14–54)
ANION GAP: UNDETERMINED (ref 5–15)
AST: 59 U/L — ABNORMAL HIGH (ref 15–41)
Albumin: 3.2 g/dL — ABNORMAL LOW (ref 3.5–5.0)
BUN: 83 mg/dL — ABNORMAL HIGH (ref 6–20)
CALCIUM: 10.2 mg/dL (ref 8.9–10.3)
CO2: 28 mmol/L (ref 22–32)
CREATININE: 2.4 mg/dL — AB (ref 0.44–1.00)
Chloride: >130 mmol/L (ref 101–111)
GFR, EST AFRICAN AMERICAN: 19 mL/min — AB (ref >60)
GFR, EST NON AFRICAN AMERICAN: 16 mL/min — AB (ref >60)
Glucose, Bld: 92 mg/dL (ref 65–99)
Potassium: 4.8 mmol/L (ref 3.5–5.1)
Sodium: 167 mmol/L (ref 135–145)
TOTAL PROTEIN: 7.8 g/dL (ref 6.5–8.1)
Total Bilirubin: 1.8 mg/dL — ABNORMAL HIGH (ref 0.3–1.2)

## 2015-06-14 LAB — URINALYSIS COMPLETE WITH MICROSCOPIC (ARMC ONLY)
BILIRUBIN URINE: NEGATIVE
Glucose, UA: NEGATIVE mg/dL
KETONES UR: NEGATIVE mg/dL
Leukocytes, UA: NEGATIVE
NITRITE: NEGATIVE
PH: 6 (ref 5.0–8.0)
PROTEIN: 30 mg/dL — AB
Specific Gravity, Urine: 1.018 (ref 1.005–1.030)

## 2015-06-14 LAB — CBC WITH DIFFERENTIAL/PLATELET
BLASTS: 0 %
Band Neutrophils: 1 %
Basophils Absolute: 0 10*3/uL (ref 0–0.1)
Basophils Relative: 0 %
Eosinophils Absolute: 0 10*3/uL (ref 0–0.7)
Eosinophils Relative: 0 %
HEMATOCRIT: 34.8 % — AB (ref 35.0–47.0)
HEMOGLOBIN: 10.3 g/dL — AB (ref 12.0–16.0)
Lymphocytes Relative: 10 %
Lymphs Abs: 0.4 10*3/uL — ABNORMAL LOW (ref 1.0–3.6)
MCH: 25.7 pg — ABNORMAL LOW (ref 26.0–34.0)
MCHC: 29.6 g/dL — ABNORMAL LOW (ref 32.0–36.0)
MCV: 86.8 fL (ref 80.0–100.0)
MONO ABS: 0 10*3/uL — AB (ref 0.2–0.9)
MYELOCYTES: 0 %
Metamyelocytes Relative: 0 %
Monocytes Relative: 1 %
NEUTROS PCT: 88 %
NRBC: 4 /100{WBCs} — AB
Neutro Abs: 3.1 10*3/uL (ref 1.4–6.5)
Other: 0 %
PLATELETS: 45 10*3/uL — AB (ref 150–440)
PROMYELOCYTES ABS: 0 %
RBC: 4.01 MIL/uL (ref 3.80–5.20)
RDW: 21.4 % — ABNORMAL HIGH (ref 11.5–14.5)
WBC: 3.5 10*3/uL — AB (ref 3.6–11.0)

## 2015-06-14 LAB — LACTIC ACID, PLASMA: Lactic Acid, Venous: 1.9 mmol/L (ref 0.5–2.0)

## 2015-06-14 LAB — LIPASE, BLOOD: Lipase: 45 U/L (ref 11–51)

## 2015-06-14 LAB — TROPONIN I: Troponin I: 0.11 ng/mL — ABNORMAL HIGH (ref <0.031)

## 2015-06-14 MED ORDER — SODIUM CHLORIDE 0.9 % IV BOLUS (SEPSIS)
500.0000 mL | Freq: Once | INTRAVENOUS | Status: AC
  Administered 2015-06-14: 500 mL via INTRAVENOUS

## 2015-06-14 MED ORDER — SODIUM CHLORIDE 0.9 % IV BOLUS (SEPSIS)
150.0000 mL | Freq: Once | INTRAVENOUS | Status: AC
  Administered 2015-06-14: 150 mL via INTRAVENOUS

## 2015-06-14 MED ORDER — ACETAMINOPHEN 325 MG PO TABS: 650.0000 mg | ORAL_TABLET | Freq: Four times a day (QID) | ORAL | Status: DC | PRN

## 2015-06-14 MED ORDER — ACETAMINOPHEN 650 MG RE SUPP: 650.0000 mg | Freq: Four times a day (QID) | RECTAL | Status: DC | PRN

## 2015-06-14 MED ORDER — SODIUM CHLORIDE 0.9 % IV BOLUS (SEPSIS)
1000.0000 mL | Freq: Once | INTRAVENOUS | Status: AC
  Administered 2015-06-14: 1000 mL via INTRAVENOUS

## 2015-06-14 MED ORDER — DEXTROSE 5 % IV SOLN
INTRAVENOUS | Status: DC
  Administered 2015-06-14 – 2015-06-16 (×6): via INTRAVENOUS
  Administered 2015-06-17: 1 mL via INTRAVENOUS
  Administered 2015-06-17 – 2015-06-21 (×7): via INTRAVENOUS

## 2015-06-14 NOTE — ED Provider Notes (Signed)
[facility] Emergency Department Provider Note  ____________________________________________  Time seen: 3:10 PM  I have reviewed the triage vital signs and the nursing notes.   HISTORY  Chief Complaint Failure To Thrive Level 5 caveat:  Portions of the history and physical were unable to be obtained due to the patient's acute illness and chronic dementia    HPI Kimberly Acosta is a 81 y.o. female brought to the ED due to decreased alertness and somnolence and not eating or drinking anything over the last 2 days. The patient has chronic kidney disease, Alzheimer's dementia, and CHF and is under hospice care at a skilled nursing facility. The patient is not able to provide any further history, and the son is unable to provide a detailed history by proxy.     Past Medical History  Diagnosis Date  . CHF (congestive heart failure) (HCC)   . Hypertension   . Renal disorder     Chronic Kidney Disease  . Alzheimer disease   . Dementia   . S/P coronary artery stent placement 2006  . Coronary artery disease     PCI and stent to LAD in 2005     Patient Active Problem List   Diagnosis Date Noted  . CHF, acute on chronic (HCC) 03/29/2015     Past Surgical History  Procedure Laterality Date  . Coronary angioplasty    . Colon surgery       Current Outpatient Rx  Name  Route  Sig  Dispense  Refill  . aspirin EC 81 MG tablet   Oral   Take 81 mg by mouth daily.         Marland Kitchen. ENSURE (ENSURE)   Oral   Take 237 mLs by mouth 3 (three) times daily between meals.         . furosemide (LASIX) 40 MG tablet   Oral   Take 40 mg by mouth daily.         Marland Kitchen. losartan (COZAAR) 50 MG tablet   Oral   Take 50 mg by mouth daily.         . metoprolol tartrate (LOPRESSOR) 25 MG tablet   Oral   Take 12.5 mg by mouth 2 (two) times daily.          Marland Kitchen. spironolactone (ALDACTONE) 25 MG tablet   Oral   Take 25 mg by mouth daily.             Allergies Review of patient's allergies indicates no known allergies.   No family history on file.  Social History Social History  Substance Use Topics  . Smoking status: Former Smoker    Quit date: 07/28/1978  . Smokeless tobacco: None  . Alcohol Use: No    Review of Systems Unable to obtain ____________________________________________   PHYSICAL EXAM:  VITAL SIGNS: ED Triage Vitals  Enc Vitals Group     BP 06/14/15 1417 103/20 mmHg     Pulse Rate 06/14/15 1417 54     Resp 06/14/15 1417 16     Temp 06/14/15 1434 97.2 F (36.2 C)     Temp Source 06/14/15 1434 Axillary     SpO2 06/14/15 1417 87 %     Weight 06/14/15 1417 126 lb 12.8 oz (57.516 kg)     Height --      Head Cir --      Peak Flow --      Pain Score --      Pain Loc --  Pain Edu? --      Excl. in GC? --      Constitutional:   Somnolent, flinches with examination.[name]. Eyes:   No scleral icterus. No conjunctival pallor. PERRL. EOMI ENT   Head:   Normocephalic and atraumatic.   Nose:   No congestion/rhinnorhea. No septal hematoma   Mouth/Throat:   Dry mucous membranes, no pharyngeal erythema. No peritonsillar mass. No uvula shift.   Neck:   No stridor. No SubQ emphysema. No meningismus. Hematological/Lymphatic/Immunilogical:   No cervical lymphadenopathy. Cardiovascular:   Bradycardia heart rate in the 50s. Normal and symmetric distal pulses are present in all extremities. There is a loud systolic murmur that terminates in a wheezing sound, most audible in the left chest near the apex Respiratory:   Normal respiratory effort without tachypnea nor retractions. Diminished breath sounds on the right. No wheezes/rales/rhonchi. Gastrointestinal:   Soft and nontender. No distention. There is no CVA tenderness.  No rebound, rigidity, or guarding. Genitourinary:   deferred Musculoskeletal:   Nontender with normal range of motion in all extremities. No joint effusions.  No lower  extremity tenderness.  No edema. Neurologic:   Unable to participate in neurologic exam. GCS equals E1V2M4 = 7 Skin:    Skin is warm, dry and intact. No rash noted.  No petechiae, purpura, or bullae.  ____________________________________________    LABS (pertinent positives/negatives) (all labs ordered are listed, but only abnormal results are displayed) Labs Reviewed  URINALYSIS COMPLETEWITH MICROSCOPIC ([facility] ONLY) - Abnormal; Notable for the following:    Color, Urine YELLOW (*)    APPearance CLEAR (*)    Hgb urine dipstick 1+ (*)    Protein, ur 30 (*)    Bacteria, UA RARE (*)    Squamous Epithelial / LPF 0-5 (*)    All other components within normal limits  TROPONIN I - Abnormal; Notable for the following:    Troponin I 0.11 (*)    All other components within normal limits  COMPREHENSIVE METABOLIC PANEL - Abnormal; Notable for the following:    Sodium 167 (*)    Chloride >130 (*)    BUN 83 (*)    Creatinine, Ser 2.40 (*)    Albumin 3.2 (*)    AST 59 (*)    Total Bilirubin 1.8 (*)    GFR calc non Af Amer 16 (*)    GFR calc Af Amer 19 (*)    All other components within normal limits  CBC WITH DIFFERENTIAL/PLATELET - Abnormal; Notable for the following:    WBC 3.5 (*)    Hemoglobin 10.3 (*)    HCT 34.8 (*)    MCH 25.7 (*)    MCHC 29.6 (*)    RDW 21.4 (*)    Platelets 45 (*)    nRBC 4 (*)    Lymphs Abs 0.4 (*)    Monocytes Absolute 0.0 (*)    All other components within normal limits  CULTURE, BLOOD (SINGLE)  URINE CULTURE  LACTIC ACID, PLASMA  LIPASE, BLOOD  CBC WITH DIFFERENTIAL/PLATELET  LACTIC ACID, PLASMA  PATHOLOGIST SMEAR REVIEW   ____________________________________________   EKG  Interpreted by me Atrial fibrillation rate of 69, normal axis and intervals, incomplete right bundle branch block. Normal ST segments and T waves. Wandering baseline limits interpretation, the patient is unable to further participate in obtaining EKG to allow for clear or  tests. Automated computer analysis reads acute inferior infarct, but this is erroneous due to the wandering baseline.  ____________________________________________  RADIOLOGY  Chest x-ray reveals chronic right pleural effusion, right lower lobe atelectasis and some pulmonary vascular congestion without edema.  ____________________________________________   PROCEDURES CRITICAL CARE Performed by: Scotty Court, Kem Parcher   Total critical care time: 35 minutes  Critical care time was exclusive of separately billable procedures and treating other patients.  Critical care was necessary to treat or prevent imminent or life-threatening deterioration.  Critical care was time spent personally by me on the following activities: development of treatment plan with patient and/or surrogate as well as nursing, discussions with consultants, evaluation of patient's response to treatment, examination of patient, obtaining history from patient or surrogate, ordering and performing treatments and interventions, ordering and review of laboratory studies, ordering and review of radiographic studies, pulse oximetry and re-evaluation of patient's condition.   ____________________________________________   INITIAL IMPRESSION / ASSESSMENT AND PLAN / ED COURSE  Pertinent labs & imaging results that were available during my care of the patient were reviewed by me and considered in my medical decision making (see chart for details).  Patient presents with altered mental status with decreased level of consciousness. She does have chronic poor function as related to me by the hospice nurse that knows her from before this illness, but this seems to be an acute decline on top of that. I suspect that she is dehydrated, possibly from feeling sick from a urinary tract infection or pneumonia. We will check labs urinalysis and chest x-ray to evaluate.  ----------------------------------------- 7:00 PM on  06/14/2015 -----------------------------------------  Labs reveal acute renal failure with hypernatremia and hyperchloremia. Because of the patient's CK D and CHF, we'll give an initial 500 mL bolus and then proceed at 2 times maintenance rate IV fluids for gentle rehydration. I discussed everything extensively with the family for about 10 minutes, who are in agreement with hospitalization for rehydration. I did relate that the patient may have future decline similar to this if she is no longer willing to eat or drink anything, and that this may be her new baseline signaling the importance of end-of-life planning. I discussed the case with the hospitalist will evaluate for admission.     ____________________________________________   FINAL CLINICAL IMPRESSION(S) / ED DIAGNOSES  Final diagnoses:  Acute renal failure, unspecified acute renal failure type (HCC)  Acute hypernatremia  Hyperchloremia      Sharman Cheek, MD 06/14/15 1902

## 2015-06-14 NOTE — H&P (Signed)
[facility] [place] [facility]   PATIENT NAME: Kimberly Acosta    MR#:  [id number]  DATE OF BIRTH:  Jan 04, 1920  DATE OF ADMISSION:  06/14/2015  PRIMARY CARE PHYSICIAN: [facility]   REQUESTING/REFERRING PHYSICIAN: Alfonse FlavorsPhilip Stafford  CHIEF COMPLAINT:   Chief Complaint  Patient presents with  . Failure To Thrive    HISTORY OF PRESENT ILLNESS:  Kimberly Acosta  is a 81 y.o. female with a known history of dementia. She was brought in with not eating. The patient is unable to give me any history. Family present at the bedside able to give history. They have been trying to get rid of fluid with Lasix. Her legs are looking better than previously. As per the son, Friday she was nonresponsive and he went over there and then she was eating for him. In the emergency room, her sodium was found to be 167 and her creatinine was elevated at 2.4. Hospitalist services were contacted for further evaluation.  PAST MEDICAL HISTORY:   Past Medical History  Diagnosis Date  . CHF (congestive heart failure) (HCC)   . Hypertension   . Renal disorder     Chronic Kidney Disease  . Alzheimer disease   . Dementia   . S/P coronary artery stent placement 2006  . Coronary artery disease     PCI and stent to LAD in 2005    PAST SURGICAL HISTORY:   Past Surgical History  Procedure Laterality Date  . Coronary angioplasty    . Colon surgery      SOCIAL HISTORY:   Social History  Substance Use Topics  . Smoking status: Former Smoker    Quit date: 07/28/1978  . Smokeless tobacco: Not on file  . Alcohol Use: No    FAMILY HISTORY:   Family History  Problem Relation Age of Onset  . Alzheimer's disease Mother     DRUG ALLERGIES:  No Known Allergies  REVIEW OF SYSTEMS:  Unable to perform secondary to altered mental status  MEDICATIONS AT HOME:   Prior to Admission medications   Medication Sig Start Date End Date Taking? Authorizing  Provider  aspirin EC 81 MG tablet Take 81 mg by mouth daily.   Yes Historical Provider, MD  ENSURE (ENSURE) Take 237 mLs by mouth 3 (three) times daily between meals.   Yes Historical Provider, MD  furosemide (LASIX) 40 MG tablet Take 40 mg by mouth daily.   Yes Historical Provider, MD  losartan (COZAAR) 50 MG tablet Take 50 mg by mouth daily.   Yes Historical Provider, MD  metoprolol tartrate (LOPRESSOR) 25 MG tablet Take 12.5 mg by mouth 2 (two) times daily.    Yes Historical Provider, MD  spironolactone (ALDACTONE) 25 MG tablet Take 25 mg by mouth daily.   Yes Historical Provider, MD      VITAL SIGNS:  Blood pressure 101/63, pulse 52, temperature 97.2 F (36.2 C), temperature source Axillary, resp. rate 16, weight 57.516 kg (126 lb 12.8 oz), SpO2 99 %.  PHYSICAL EXAMINATION:  GENERAL:  81 y.o.-year-old patient lying in the bed with no acute distress.  EYES: Pupils equal, round, reactive to light and accommodation. No scleral icterus. Patient resisting me opening her eyes. HEENT: Head atraumatic, normocephalic. Oropharynx unable to be examined. Nasopharynx no erythema NECK:  Supple, no jugular venous distention. No thyroid enlargement, no tenderness.  LUNGS: Poor inspiratory effort. Decreased breath sounds bilaterally, no wheezing, rales,rhonchi or crepitation. No use of accessory muscles of respiration.  CARDIOVASCULAR: S1, S2 bradycardic. 3/6 systolic murmur, no rubs, or gallops.  ABDOMEN: Soft, nontender, nondistended. Bowel sounds present. No organomegaly or mass.  EXTREMITIES: 3+ edema, no cyanosis, or clubbing.  NEUROLOGIC: Patient remained sleeping the entire time I was there and I was unable to arouse her. PSYCHIATRIC: Unable to test secondary to altered mental status  SKIN: Anteriorly no rashes seen.   LABORATORY PANEL:   CBC  Recent Labs Lab 06/14/15 1706  WBC 3.5*  HGB 10.3*  HCT 34.8*  PLT 45*    ------------------------------------------------------------------------------------------------------------------  Chemistries   Recent Labs Lab 06/14/15 1706  NA 167*  K 4.8  CL >130*  CO2 28  GLUCOSE 92  BUN 83*  CREATININE 2.40*  CALCIUM 10.2  AST 59*  ALT 47  ALKPHOS 102  BILITOT 1.8*   ------------------------------------------------------------------------------------------------------------------  Cardiac Enzymes  Recent Labs Lab 06/14/15 1706  TROPONINI 0.11*   ------------------------------------------------------------------------------------------------------------------  RADIOLOGY:  Dg Chest Port 1 View  06/14/2015  CLINICAL DATA:  Altered mental status, hypoxia EXAM: PORTABLE CHEST 1 VIEW COMPARISON:  CT chest dated 03/30/2015 FINDINGS: Moderate right pleural effusion, chronic. Associated right lower lobe opacity, likely atelectasis. No frank interstitial Pulmonary vascular congestion without frank interstitial edema. No pneumothorax. The heart is top-normal in size. IMPRESSION: Moderate right pleural effusion, chronic. Associated right lower lobe opacity, likely atelectasis. Pulmonary vascular congestion without frank interstitial edema. Electronically Signed   By: Charline Bills M.D.   On: 06/14/2015 16:07    EKG:   A. fib 67 bpm  IMPRESSION AND PLAN:   1. Severe hypernatremia- Will hydrate with 2 L of normal saline and then switch over to D5W. Serial sodiums. Overall prognosis poor when sodium is this high. I doubt she will be able to keep up with her nutritional needs. Palliative care consultation. Patient follows with hospice. Full code at this point. 2. Acute renal failure- hold Lasix and spironolactone. And give IV fluid. Check creatinine on a daily basis. 3. Acute encephalopathy- likely secondary to hypernatremia, swallow evaluation tomorrow 4. Atrial fibrillation- unable to give any medication at this time 5. Thrombocytopenia- looking back  at old labs this is chronic 6. Dementia without behavioral disturbance  All the records are reviewed and case discussed with ED provider. Management plans discussed with the patient, family and they are in agreement.  CODE STATUS: Full code  TOTAL TIME TAKING CARE OF THIS PATIENT: 50 minutes.    Alford Highland M.D on 06/14/2015 at 7:48 PM  Between 7am to 6pm - Pager - [phone]  After 6pm call admission pager [phone]  [facility] Hospital[facility]  Office  [phone]  CC: Primary care physician; [facility]

## 2015-06-14 NOTE — Progress Notes (Addendum)
ED visit made. [facility] with a  Hospice diagnosis of CHF and stage III CKD. She is a Full Code. Writer was notified by patient's hospice RN that she was to arrive via EMS for evaluation of decreased po intake x 2 days, decreased level of consciousness/lethargy. Hospice team has a had conversation with patient's son Ed regarding his mother's current condition. He was very clear in his wishes that he wanted his mother evaluated "to see if anything can be done". Patient seen lying on the ED stretcher, briefly opened her eyes to gentle stimulation, no verbal response, hands cool to touch. Oxygen at 2 liters, heart rate on monitor in the 50's. Staff RN and attending physician made aware that patient is followed by [facility] and of son's wishes for treatement. Hospice team updated. Dayna BarkerKaren Robertson RN, BSN , [facility], [facility] Liaison [phone] c

## 2015-06-14 NOTE — ED Notes (Signed)
Per EMS report, patient's son wanted to EMS to transport patient to ED for evaluation of poor PO intake and increased fatigue. Patient is a hospice patient, but is not a DNR. Hospice is aware that patient was transported to hospital per EMS report and had spoken with the son. Patient was initially alert upon arrival to the ED, but fell asleep shortly after arriving. Patient is easily aroused, but is not verbal to this writer and cannot maintain alertness when left alone.

## 2015-06-15 LAB — CBC
HCT: 34.5 % — ABNORMAL LOW (ref 35.0–47.0)
Hemoglobin: 10.6 g/dL — ABNORMAL LOW (ref 12.0–16.0)
MCH: 26.7 pg (ref 26.0–34.0)
MCHC: 30.7 g/dL — ABNORMAL LOW (ref 32.0–36.0)
MCV: 87.1 fL (ref 80.0–100.0)
PLATELETS: 49 10*3/uL — AB (ref 150–440)
RBC: 3.96 MIL/uL (ref 3.80–5.20)
RDW: 22.4 % — AB (ref 11.5–14.5)
WBC: 3.1 10*3/uL — AB (ref 3.6–11.0)

## 2015-06-15 LAB — BASIC METABOLIC PANEL
ANION GAP: UNDETERMINED (ref 5–15)
ANION GAP: UNDETERMINED (ref 5–15)
BUN: 81 mg/dL — ABNORMAL HIGH (ref 6–20)
BUN: 82 mg/dL — AB (ref 6–20)
CALCIUM: 9.5 mg/dL (ref 8.9–10.3)
CALCIUM: 9.6 mg/dL (ref 8.9–10.3)
CO2: 26 mmol/L (ref 22–32)
CO2: 27 mmol/L (ref 22–32)
CREATININE: 2.26 mg/dL — AB (ref 0.44–1.00)
Chloride: >130 mmol/L (ref 101–111)
Creatinine, Ser: 2.25 mg/dL — ABNORMAL HIGH (ref 0.44–1.00)
GFR calc Af Amer: 20 mL/min — ABNORMAL LOW (ref >60)
GFR calc non Af Amer: 18 mL/min — ABNORMAL LOW (ref >60)
GFR, EST AFRICAN AMERICAN: 20 mL/min — AB (ref >60)
GFR, EST NON AFRICAN AMERICAN: 18 mL/min — AB (ref >60)
Glucose, Bld: 102 mg/dL — ABNORMAL HIGH (ref 65–99)
Glucose, Bld: 128 mg/dL — ABNORMAL HIGH (ref 65–99)
POTASSIUM: 4.6 mmol/L (ref 3.5–5.1)
POTASSIUM: 4.7 mmol/L (ref 3.5–5.1)
SODIUM: 165 mmol/L — AB (ref 135–145)
SODIUM: 165 mmol/L — AB (ref 135–145)

## 2015-06-15 LAB — PATHOLOGIST SMEAR REVIEW

## 2015-06-15 NOTE — Progress Notes (Signed)
Visit made. Patient is followed by [facility] with a diagnosis of CHF, CKD III. She is a Full Code. Patient seen lying in bed, eyes closed, no response to verbal stimuli. She is receiving IVF at 125 ml/hr for treatment of dehydration/hypernatremia. Son Ed at bedside. He remains steadfast in desire for continued treatment. He wants to "take it a day at a time". Writer did convey this to attending physician Dr. Cherlynn KaiserSainani. Will continue to follow and update hospice team. Dayna BarkerKaren Robertson RN, BSN, [facility] [place], [facility] [phone] c

## 2015-06-15 NOTE — Care Management Important Message (Signed)
Important Message  Patient Details  Name: Kimberly EwingMaggie Clagett MRN: [id number] Date of Birth: 11/18/1919   Medicare Important Message Given:  Yes    Adonis HugueninBerkhead, Eliyahu Bille L, RN 06/15/2015, 10:32 AM

## 2015-06-15 NOTE — Consult Note (Signed)
Palliative Care consult for goals of care and discussion of code status. Patient is currently being followed [facility]F and chronic kidney disease. She is currently a full code. Patient is found laying in bed unresponsive, receiving fluids, with son Ed by her side. Ed presents as guarded with interaction. Ed reports that patient has not been eating 2 days prior to this admission. Report received from Hospice team is that Ed has requested "all measures" for patient's treatment at this time. Discussion with Ed confirms this as he desires for patient to remain a full code and will make decisions regarding feeding tube and other measures "as the days go on". Discussed this interaction with Delice Bisonara, LCSW who plans to introduce the MOST form to patient's son to see if this will help in making progress with healthcare decisions moving forward. Writer informed Ed that palliative care will follow-up with this case on Monday. Ed in agreement with this plan. Healthcare team updated.  Guilford ShiEmily Howell, MSW, LCSW Palliative Care social worker [phone] (c)

## 2015-06-15 NOTE — Progress Notes (Signed)
Initial Nutrition Assessment    INTERVENTION:  Coordination of care: Await SLP recommendations regarding diet order Medical Nutrition Supplement: Recommend ensure/magic cup pending diet consistency for added nutrition   NUTRITION DIAGNOSIS:   Inadequate oral intake related to acute illness as evidenced by NPO status.    GOAL:   Patient will meet greater than or equal to 90% of their needs    MONITOR:    (Energy intake, Electrolyte and renal profile, Anthropometric)  REASON FOR ASSESSMENT:   Malnutrition Screening Tool    ASSESSMENT:      Pt admitted with failure to thrive, hypernatremia, ARF.  Palliative care consulted  Past Medical History  Diagnosis Date  . CHF (congestive heart failure) (HCC)   . Hypertension   . Renal disorder     Chronic Kidney Disease  . Alzheimer disease   . Dementia   . S/P coronary artery stent placement 2006  . Coronary artery disease     PCI and stent to LAD in 2005    Current Nutrition: NPO  Food/Nutrition-Related History: Son at bedside and reports not eating for the past 2 days except drank 1 and 1/2 ensure.  Prior to that son would check on pt every pm and feed her dinner and reports would eat mostly all of dinner meal each day.  Thinks ate fairly well at breakfast and lunch.        Continuous Medications:  . dextrose 125 mL/hr at 06/15/15 0753     Electrolyte/Renal Profile and Glucose Profile:   Recent Labs Lab 06/14/15 1706 06/14/15 2332 06/15/15 0452  NA 167* 165* 165*  K 4.8 4.7 4.6  CL >130* >130* >130*  CO2 28 26 27   BUN 83* 82* 81*  CREATININE 2.40* 2.25* 2.26*  CALCIUM 10.2 9.5 9.6  GLUCOSE 92 102* 128*   Protein Profile:  Recent Labs Lab 06/14/15 1706  ALBUMIN 3.2*    Gastrointestinal Profile: Last BM: unknown   Nutrition-Focused Physical Exam Findings: Nutrition-Focused physical exam completed. Findings are no fat depletion, none to mild muscle depletion, and unable to assess edema.       Weight Change: Son reports wt loss of 9 pounds in the last 2 weeks. Noted per wt encounters  4% weight loss in the last 2 months Son did report pt with increased swelling in legs prior to admission, unsure if some wt loss was fluid wt loss.    Diet Order:  Diet NPO time specified  Skin:   reviewed   Height:   Ht Readings from Last 1 Encounters:  06/14/15 5' (1.524 m)    Weight:   Wt Readings from Last 1 Encounters:  06/14/15 140 lb 4.8 oz (63.64 kg)    Ideal Body Weight:     BMI:  Body mass index is 27.4 kg/(m^2).  Estimated Nutritional Needs:   Kcal:  Using IBW of 45kg BEE 776 kcals (IF 1.1-1.3, AF 1.2) [id number] kcals/d  Protein:  (1.2-1.5 g/kg) 54-68 gm/d  Fluid:  (25-4130ml/kg) 1125-138050ml/d  EDUCATION NEEDS:   No education needs identified at this time  MODERATE Care Level  Hebah Bogosian B. Freida BusmanAllen, RD, LDN [phone] (pager)

## 2015-06-15 NOTE — Progress Notes (Signed)
[facility] - [place] at [facility]   PATIENT NAME: Kimberly Acosta    MR#:  [id number]  DATE OF BIRTH:  08-14-1919  SUBJECTIVE:  CHIEF COMPLAINT:   Chief Complaint  Patient presents with  . Failure To Thrive   Patient here due to altered mental status and noted to be severely dehydrated in acute renal failure and also severely hypernatremic.  Son at bedside  REVIEW OF SYSTEMS:    Review of Systems  Unable to perform ROS: mental acuity   Nutrition: NPO Tolerating Diet: NPO Tolerating PT: Await eval once mental status improves   DRUG ALLERGIES:  No Known Allergies  VITALS:  Blood pressure 122/60, pulse 53, temperature 97.2 F (36.2 C), temperature source Axillary, resp. rate 17, height 5' (1.524 m), weight 63.64 kg (140 lb 4.8 oz), SpO2 97 %.  PHYSICAL EXAMINATION:   Physical Exam  GENERAL:  81 y.o.-year-old patient lying in the bed encephalopathic in NAD.  EYES: Pupils equal, round, reactive to light and accommodation. No scleral icterus. Extraocular muscles intact.  HEENT: Head atraumatic, normocephalic. Oropharynx and nasopharynx clear. Dry oral mucosa.  NECK:  Supple, no jugular venous distention. No thyroid enlargement, no tenderness.  LUNGS: Normal breath sounds bilaterally, no wheezing, rales, rhonchi. No use of accessory muscles of respiration.  CARDIOVASCULAR: S1, S2 normal. No murmurs, rubs, or gallops.  ABDOMEN: Soft, nontender, nondistended. Bowel sounds present. No organomegaly or mass.  EXTREMITIES: No cyanosis, clubbing or edema b/l.    NEUROLOGIC: Cranial nerves II through XII are intact. No focal Motor or sensory deficits b/l. Globally weak and encephalopathic PSYCHIATRIC: encephalopathic.  SKIN: No obvious rash, lesion, or ulcer.    LABORATORY PANEL:   CBC  Recent Labs Lab 06/15/15 0452  WBC 3.1*  HGB 10.6*  HCT 34.5*  PLT 49*    ------------------------------------------------------------------------------------------------------------------  Chemistries   Recent Labs Lab 06/14/15 1706  06/15/15 0452  NA 167*  < > 165*  K 4.8  < > 4.6  CL >130*  < > >130*  CO2 28  < > 27  GLUCOSE 92  < > 128*  BUN 83*  < > 81*  CREATININE 2.40*  < > 2.26*  CALCIUM 10.2  < > 9.6  AST 59*  --   --   ALT 47  --   --   ALKPHOS 102  --   --   BILITOT 1.8*  --   --   < > = values in this interval not displayed. ------------------------------------------------------------------------------------------------------------------  Cardiac Enzymes  Recent Labs Lab 06/14/15 1706  TROPONINI 0.11*   ------------------------------------------------------------------------------------------------------------------  RADIOLOGY:  Dg Chest Port 1 View  06/14/2015  CLINICAL DATA:  Altered mental status, hypoxia EXAM: PORTABLE CHEST 1 VIEW COMPARISON:  CT chest dated 03/30/2015 FINDINGS: Moderate right pleural effusion, chronic. Associated right lower lobe opacity, likely atelectasis. No frank interstitial Pulmonary vascular congestion without frank interstitial edema. No pneumothorax. The heart is top-normal in size. IMPRESSION: Moderate right pleural effusion, chronic. Associated right lower lobe opacity, likely atelectasis. Pulmonary vascular congestion without frank interstitial edema. Electronically Signed   By: Charline Bills M.D.   On: 06/14/2015 16:07     ASSESSMENT AND PLAN:   81 year old female with past medical history of dementia, history of CHF, coronary artery disease, hypertension who presented to the hospital due to altered mental status and noted to be severely hypernatremic and dehydrated  #1 altered mental status-this is likely metabolic encephalopathy due to the acute renal failure, severe hypernatremia - follow  mental status as electrolytes correct and renal function improves.  - no evidence of acute  infectious source.   #2 Hypernatremia - cont. D5W and follow sodium.  - due to severe dehydration.   #3 ARF - likely due to dehydration and poor PO intake and concomitant use of diuretics.  - cont. IV fluids and follow BUN/Cr.  - hold Losartan, lasix, Aldactone for now.   #4 hx of CHF - chronic diastolic CHF and clinically patient is not in CHF. -Hold diuretics. Hold metoprolol  #5 Thrombocytopenia - chronic in nature and follow platelet count. -No acute bleeding.  Patient is already followed by hospice at the assisted living.  All the records are reviewed and case discussed with Care Management/Social Workerr. Management plans discussed with the patient, family and they are in agreement.  CODE STATUS: Full  DVT Prophylaxis: Teds and SCDs  TOTAL TIME TAKING CARE OF THIS PATIENT: 30 minutes.   POSSIBLE D/C IN 2-3 DAYS, DEPENDING ON CLINICAL CONDITION.   [place] SirenSAINANI,VIVEK J M.D on 06/15/2015 at 3:31 PM  Between 7am to 6pm - Pager - [phone]  After 6pm go to [email or web address] - password [email or web address] [facility]  [facility]  Office  [phone]  CC: Primary care physician; [facility]

## 2015-06-15 NOTE — Progress Notes (Signed)
Speech Therapy Note: reviewed chart notes; received order, consulted NSG re: pt's status. Pt is less alert and medical status is declined at this time. Pt is not appropriate for BSE currently; NSG and Son agreed. Rec. Oral care during NPO status. ST will f/u tomorrow to attempt BSE.

## 2015-06-15 NOTE — Progress Notes (Signed)
Critical lab values reported ( Sodium-165, Chloride- >130). Results reported to Dr. Sheryle Hailiamond and new orders given to bump fluids up to 125.

## 2015-06-16 ENCOUNTER — Inpatient Hospital Stay

## 2015-06-16 LAB — BASIC METABOLIC PANEL
Anion gap: 7 (ref 5–15)
BUN: 67 mg/dL — ABNORMAL HIGH (ref 6–20)
CALCIUM: 9.1 mg/dL (ref 8.9–10.3)
CO2: 24 mmol/L (ref 22–32)
CREATININE: 2.34 mg/dL — AB (ref 0.44–1.00)
Chloride: 123 mmol/L — ABNORMAL HIGH (ref 101–111)
GFR, EST AFRICAN AMERICAN: 20 mL/min — AB (ref >60)
GFR, EST NON AFRICAN AMERICAN: 17 mL/min — AB (ref >60)
Glucose, Bld: 102 mg/dL — ABNORMAL HIGH (ref 65–99)
Potassium: 3.9 mmol/L (ref 3.5–5.1)
SODIUM: 154 mmol/L — AB (ref 135–145)

## 2015-06-16 NOTE — Progress Notes (Signed)
When pt was assessed, RN did additional Neuro check due to family's request. To RN findings, Right pupil is irregular shape and very sluggish; Left pupil is round shape and very sluggish to react; Grips are present. No neuro checks have been charted previously. Dr Clint GuyHower was notified, no new orders given at this time.   Kimberly Acosta

## 2015-06-16 NOTE — Plan of Care (Signed)
Problem: Education: Goal: Knowledge of [facility] Education information/materials will improve Outcome: Not Progressing Dementia, failure to thrive, nonverbal,  Kimberly Acosta     Problem: Health Behavior/Discharge Planning: Goal: Ability to manage health-related needs will improve Outcome: Not Progressing DEMENTIA, FAILURE TO THRIVE, NON VERBAL Kimberly Acosta     Problem: Activity: Goal: Risk for activity intolerance will decrease Outcome: Not Progressing DEMENTIA, FAILURE TO THRIVE,  NON VERBAL, DOES NOT FOLLOW COMMANDS Kimberly Acosta     Problem: Nutrition: Goal: Adequate nutrition will be maintained Outcome: Not Progressing DEMENTIA, FAILURE TO THRIVE, NON VERBAL, NPO STATUS Kimberly Acosta,Kimberly Acosta

## 2015-06-16 NOTE — Progress Notes (Signed)
Spoke to Dr. Cherlynn KaiserSainani about pt rectal temp of 94.6 and unable to calculate axillary and oral temp. Order for bear hugger.

## 2015-06-16 NOTE — Progress Notes (Signed)
[facility] - [place] at [facility]   PATIENT NAME: Kimberly Acosta    MR#:  [id number]  DATE OF BIRTH:  [birth year]  SUBJECTIVE:  CHIEF COMPLAINT:   Chief Complaint  Patient presents with  . Failure To Thrive   Patient here due to altered mental status and noted to be severely dehydrated and in acute renal failure and also severely hypernatremic.  Son at bedside  Sodium improving.  Renal function improving.  Still encephalopathic.   REVIEW OF SYSTEMS:    Review of Systems  Unable to perform ROS: mental acuity   Nutrition: NPO Tolerating Diet: NPO Tolerating PT: Await eval once mental status improves   DRUG ALLERGIES:  No Known Allergies  VITALS:  Blood pressure 91/61, pulse 64, temperature 95.9 F (35.5 C), temperature source Rectal, resp. rate 18, height 5' (1.524 m), weight 63.64 kg (140 lb 4.8 oz), SpO2 99 %.  PHYSICAL EXAMINATION:   Physical Exam  GENERAL:  81 y.o.-year-old patient lying in the bed encephalopathic in NAD.  EYES: Pupils equal, round, reactive to light and accommodation. No scleral icterus. Extraocular muscles intact.  HEENT: Head atraumatic, normocephalic. Oropharynx and nasopharynx clear. Pooling of sputum in the mouth.  NECK:  Supple, no jugular venous distention. No thyroid enlargement, no tenderness.  LUNGS: Poor Resp. effort, no wheezing, rales, rhonchi. No use of accessory muscles of respiration.  CARDIOVASCULAR: S1, S2 normal. No murmurs, rubs, or gallops.  ABDOMEN: Soft, nontender, nondistended. Bowel sounds present. No organomegaly or mass.  EXTREMITIES: No cyanosis, clubbing or edema b/l.    NEUROLOGIC: Cranial nerves II through XII are intact. No focal Motor or sensory deficits b/l. Globally weak and encephalopathic PSYCHIATRIC: encephalopathic.  SKIN: No obvious rash, lesion, or ulcer.    LABORATORY PANEL:   CBC  Recent Labs Lab 06/15/15 0452  WBC 3.1*  HGB 10.6*  HCT 34.5*  PLT 49*    ------------------------------------------------------------------------------------------------------------------  Chemistries   Recent Labs Lab 06/14/15 1706  06/16/15 0626  NA 167*  < > 154*  K 4.8  < > 3.9  CL >130*  < > 123*  CO2 28  < > 24  GLUCOSE 92  < > 102*  BUN 83*  < > 67*  CREATININE 2.40*  < > 2.34*  CALCIUM 10.2  < > 9.1  AST 59*  --   --   ALT 47  --   --   ALKPHOS 102  --   --   BILITOT 1.8*  --   --   < > = values in this interval not displayed. ------------------------------------------------------------------------------------------------------------------  Cardiac Enzymes  Recent Labs Lab 06/14/15 1706  TROPONINI 0.11*   ------------------------------------------------------------------------------------------------------------------  RADIOLOGY:  Dg Chest Port 1 View  06/14/2015  CLINICAL DATA:  Altered mental status, hypoxia EXAM: PORTABLE CHEST 1 VIEW COMPARISON:  CT chest dated 03/30/2015 FINDINGS: Moderate right pleural effusion, chronic. Associated right lower lobe opacity, likely atelectasis. No frank interstitial Pulmonary vascular congestion without frank interstitial edema. No pneumothorax. The heart is top-normal in size. IMPRESSION: Moderate right pleural effusion, chronic. Associated right lower lobe opacity, likely atelectasis. Pulmonary vascular congestion without frank interstitial edema. Electronically Signed   By: Charline Bills M.D.   On: 06/14/2015 16:07     ASSESSMENT AND PLAN:   81 year old female with past medical history of dementia, history of CHF, coronary artery disease, hypertension who presented to the hospital due to altered mental status and noted to be severely hypernatremic and dehydrated  #1 altered mental status-this  is likely metabolic encephalopathy due to the acute renal failure, severe hypernatremia - cont. To follow mental status as electrolytes correct and renal function improves.  - no evidence of acute  infectious source.   #2 Hypernatremia - cont. D5W and sodium improving.  - due to severe dehydration.   #3 ARF - likely due to dehydration and poor PO intake and concomitant use of diuretics.  - cont. IV fluids and follow BUN/Cr. Cr. Improving w/ fluids.  - hold Losartan, lasix, Aldactone for now.   #4 hx of CHF - chronic diastolic CHF and clinically patient is not in CHF. -Hold diuretics. Hold metoprolol  #5 Thrombocytopenia - chronic in nature and follow platelet count. -No acute bleeding.  Patient is already followed by hospice at the assisted living.  All the records are reviewed and case discussed with Care Management/Social Workerr. Management plans discussed with the patient, family and they are in agreement.  CODE STATUS: Full  DVT Prophylaxis: Teds and SCDs  TOTAL TIME TAKING CARE OF THIS PATIENT: 25 minutes.   POSSIBLE D/C IN 2-3 DAYS, DEPENDING ON CLINICAL CONDITION.   [place] SirenSAINANI,VIVEK J M.D on 06/16/2015 at 2:34 PM  Between 7am to 6pm - Pager - [phone]  After 6pm go to [email or web address] - password [email or web address] [facility]  [facility]  Office  [phone]  CC: Primary care physician; [facility]

## 2015-06-16 NOTE — Progress Notes (Signed)
Speech Therapy Note: reviewed chart notes; attempted to see pt again today for assessment of readiness for BSE. Pt continues to remain lethargic and minimally responsive to verbal/tacile stim. Pt does NOT appear able to sufficiently alert to safely participate in po trials, meds, or meals at this time; her risk for aspiration is High. Rec. continue NPO status w/ oral care for oral stim and hygiene. NSG consulted re: above and agreed. ST will f/u on Monday for continued assessment for readiness for BSE.

## 2015-06-17 LAB — BASIC METABOLIC PANEL
Anion gap: 7 (ref 5–15)
BUN: 69 mg/dL — AB (ref 6–20)
CHLORIDE: 119 mmol/L — AB (ref 101–111)
CO2: 22 mmol/L (ref 22–32)
Calcium: 8.7 mg/dL — ABNORMAL LOW (ref 8.9–10.3)
Creatinine, Ser: 2.84 mg/dL — ABNORMAL HIGH (ref 0.44–1.00)
GFR calc Af Amer: 15 mL/min — ABNORMAL LOW (ref >60)
GFR calc non Af Amer: 13 mL/min — ABNORMAL LOW (ref >60)
GLUCOSE: 102 mg/dL — AB (ref 65–99)
POTASSIUM: 3.9 mmol/L (ref 3.5–5.1)
Sodium: 148 mmol/L — ABNORMAL HIGH (ref 135–145)

## 2015-06-17 MED ORDER — GLYCOPYRROLATE 0.2 MG/ML IJ SOLN
0.1000 mg | Freq: Four times a day (QID) | INTRAMUSCULAR | Status: DC | PRN
  Administered 2015-06-17: 0.1 mg via INTRAVENOUS
  Filled 2015-06-17: qty 1

## 2015-06-17 MED ORDER — FUROSEMIDE 10 MG/ML IJ SOLN
80.0000 mg | Freq: Once | INTRAMUSCULAR | Status: AC
  Administered 2015-06-17: 80 mg via INTRAVENOUS
  Filled 2015-06-17: qty 8

## 2015-06-17 NOTE — Progress Notes (Signed)
Dr. Sheryle Hailiamond notified of no urine output and bladder scan results (210ml). Orders for lasix 80mg  iv x 1 given. Will continue to monitor.

## 2015-06-17 NOTE — Progress Notes (Signed)
[facility] - [place] at [facility]   PATIENT NAME: Kimberly EwingMaggie Acosta    MR#:  [id number]  DATE OF BIRTH:  [birth year]  SUBJECTIVE:   Patient here due to altered mental status and noted to be severely dehydrated and in acute renal failure and also severely hypernatremic.  Son at bedside  Sodium improving.  Renal function improving.  Remains encephalopathic.  Had CXR yesterday showing mild volume overload and CHF and therefore given one dose of Lasix this a.m.   REVIEW OF SYSTEMS:    Review of Systems  Unable to perform ROS: mental acuity   Nutrition: NPO Tolerating Diet: NPO Tolerating PT: Await eval once mental status improves   DRUG ALLERGIES:  No Known Allergies  VITALS:  Blood pressure 107/77, pulse 65, temperature 97.3 F (36.3 C), temperature source Oral, resp. rate 18, height 5' (1.524 m), weight 63.64 kg (140 lb 4.8 oz), SpO2 100 %.  PHYSICAL EXAMINATION:   Physical Exam  GENERAL:  81 y.o.-year-old patient lying in the bed encephalopathic in NAD.  EYES: Pupils equal, round, reactive to light and accommodation. No scleral icterus. Extraocular muscles intact.  HEENT: Head atraumatic, normocephalic. Oropharynx and nasopharynx clear. Pooling of sputum in the mouth.  NECK:  Supple, no jugular venous distention. No thyroid enlargement, no tenderness.  LUNGS: Poor Resp. effort, no wheezing, rales, rhonchi. No use of accessory muscles of respiration.  CARDIOVASCULAR: S1, S2 normal. No murmurs, rubs, or gallops.  ABDOMEN: Soft, nontender, nondistended. Bowel sounds present. No organomegaly or mass.  EXTREMITIES: No cyanosis, clubbing or edema b/l.    NEUROLOGIC: Encephalopathic and difficult to do a good Neurological exam.  PSYCHIATRIC: encephalopathic.  SKIN: No obvious rash, lesion, or ulcer.    LABORATORY PANEL:   CBC  Recent Labs Lab 06/15/15 0452  WBC 3.1*  HGB 10.6*  HCT 34.5*  PLT 49*    ------------------------------------------------------------------------------------------------------------------  Chemistries   Recent Labs Lab 06/14/15 1706  06/17/15 0443  NA 167*  < > 148*  K 4.8  < > 3.9  CL >130*  < > 119*  CO2 28  < > 22  GLUCOSE 92  < > 102*  BUN 83*  < > 69*  CREATININE 2.40*  < > 2.84*  CALCIUM 10.2  < > 8.7*  AST 59*  --   --   ALT 47  --   --   ALKPHOS 102  --   --   BILITOT 1.8*  --   --   < > = values in this interval not displayed. ------------------------------------------------------------------------------------------------------------------  Cardiac Enzymes  Recent Labs Lab 06/14/15 1706  TROPONINI 0.11*   ------------------------------------------------------------------------------------------------------------------  RADIOLOGY:  Dg Chest 2 View  06/16/2015  CLINICAL DATA:  Crackles.  Unresponsive. EXAM: CHEST  2 VIEW COMPARISON:  06/14/2015 FINDINGS: There is a moderate right pleural effusion. There is a small left pleural effusion. There is bilateral lower lobe airspace disease which may reflect atelectasis, unchanged from 06/14/2015. There is no pneumothorax. There is stable cardiomegaly. There is thoracic aortic atherosclerosis. There is no acute osseous abnormality. IMPRESSION: Findings most compatible live CHF. Electronically Signed   By: Elige KoHetal  Patel   On: 06/16/2015 19:06     ASSESSMENT AND PLAN:   81 year old female with past medical history of dementia, history of CHF, coronary artery disease, hypertension who presented to the hospital due to altered mental status and noted to be severely hypernatremic and dehydrated  #1 altered mental status-this is likely metabolic encephalopathy due to the acute  renal failure, severe hypernatremia - cont. To follow mental status and slow to improve as electrolytes and renal function continues to improve. - no evidence of acute infectious source.  - if not improving in the next 24-48  hrs would consider CT head and Neuro work up.    #2 Hypernatremia - cont. D5W and sodium improving.  - due to severe dehydration.   #3 ARF - likely due to dehydration and poor PO intake and concomitant use of diuretics.  - cont. IV fluids and follow BUN/Cr. Cr. Improving w/ fluids and will monitor.  - hold Losartan, lasix, Aldactone for now.   #4 hx of CHF - CXR yesterday showing some mild volume overload and therefore given one dose of lasix this a.m. Will monitor.  Will dose diuretics on PRN basis for now.  - Hold metoprolol  #5 Thrombocytopenia - chronic in nature and follow platelet count. -No acute bleeding.  Patient is already followed by hospice at the assisted living. Discussed plan of care with patient's son at bedside.   All the records are reviewed and case discussed with Care Management/Social Workerr. Management plans discussed with the patient, family and they are in agreement.  CODE STATUS: Full  DVT Prophylaxis: Teds and SCDs  TOTAL TIME TAKING CARE OF THIS PATIENT: 30 minutes.   POSSIBLE D/C IN 2-3 DAYS, DEPENDING ON CLINICAL CONDITION.   [place] Siren M.D on 06/17/2015 at 12:57 PM  Between 7am to 6pm - Pager - [phone]  After 6pm go to [email or web address] - password [email or web address] [facility]  [place] [facility]  Office  [phone]  CC: Primary care physician; [facility]

## 2015-06-17 NOTE — Progress Notes (Signed)
Rectal temp of 94.6 temp. Bair hugger applied - see prior order. Will continue to monitor.

## 2015-06-17 NOTE — Progress Notes (Signed)
CSW attempted to meet with Pt's son [name] to discuss Pt's dc planning. Pt's son not at bedside during CSW visit, friend of son sitting with Pt while son went home to shower.  CSW will follow up again on Monday.   Kimberly Gristara Helyne Genther, LCSW

## 2015-06-18 ENCOUNTER — Inpatient Hospital Stay

## 2015-06-18 LAB — URINE CULTURE: Culture: 5000

## 2015-06-18 LAB — BASIC METABOLIC PANEL
ANION GAP: 8 (ref 5–15)
BUN: 67 mg/dL — ABNORMAL HIGH (ref 6–20)
CALCIUM: 8.7 mg/dL — AB (ref 8.9–10.3)
CO2: 22 mmol/L (ref 22–32)
Chloride: 116 mmol/L — ABNORMAL HIGH (ref 101–111)
Creatinine, Ser: 2.93 mg/dL — ABNORMAL HIGH (ref 0.44–1.00)
GFR calc Af Amer: 15 mL/min — ABNORMAL LOW (ref >60)
GFR calc non Af Amer: 13 mL/min — ABNORMAL LOW (ref >60)
GLUCOSE: 96 mg/dL (ref 65–99)
Potassium: 3.8 mmol/L (ref 3.5–5.1)
Sodium: 146 mmol/L — ABNORMAL HIGH (ref 135–145)

## 2015-06-18 LAB — BLOOD GAS, ARTERIAL
ALLENS TEST (PASS/FAIL): POSITIVE — AB
Acid-base deficit: 0.1 mmol/L (ref 0.0–2.0)
BICARBONATE: 23.9 meq/L (ref 21.0–28.0)
FIO2: 0.24
O2 Saturation: 88 %
PH ART: 7.43 (ref 7.350–7.450)
Patient temperature: 37
pCO2 arterial: 36 mmHg (ref 32.0–48.0)
pO2, Arterial: 53 mmHg — ABNORMAL LOW (ref 83.0–108.0)

## 2015-06-18 LAB — AMMONIA: Ammonia: 80 umol/L — ABNORMAL HIGH (ref 9–35)

## 2015-06-18 NOTE — Care Management Important Message (Signed)
Important Message  Patient Details  Name: Kimberly Acosta MRN: [id number] Date of Birth: 1920/01/12   Medicare Important Message Given:  Yes    Olegario MessierKathy A Caydence Koenig 06/18/2015, 10:22 AM

## 2015-06-18 NOTE — Consult Note (Signed)
Date: 06/18/2015                  Patient Name:  Kimberly Acosta  MRN: [id number]  DOB: 06/26/1920  Age / Sex: 81 y.o., female         PCP: [facility]                 Service Requesting Consult: Internal medicine, Dr. Cherlynn Kaiser                 Reason for Consult: Acute renal failure, hypernatremia            History of Present Illness: Patient is a 81 y.o. female with medical problems of dementia, chronic systolic congestive heart failure with EF 45-50%, moderate mitral regurgitation, dilated right ventricle, reduced right ventricular systolic function, severe tricuspid regurgitation, hypertension, chronic kidney disease, coronary disease with stent/PCN 2005,, who was admitted from nursing home to [facility] on 06/14/2015 for evaluation of severe hyponatremia, acute renal failure and acute encephalopathy.  Her son, Ed, reports that last Tuesday she was in her usual state of health.  That evening, ate dinner. All day Wednesday, she was lethargic.  She was put the hospital last Thursday because of decreased mental status, decreased by mouth intake She was found to be severely dehydrated with presenting sodium 167.  Serum creatinine had increased to 2.40.  Baseline creatinine appears to be 1.45/GFR of 35 from May 21, 2015 Patient was hydrated and her sodium level has improved to 146 today. However, serum creatinine continues to increase and today level is 2.93 Her ammonia level is 80   Medications: Outpatient medications: Prescriptions prior to admission  Medication Sig Dispense Refill Last Dose  . aspirin EC 81 MG tablet Take 81 mg by mouth daily.   06/14/2015 at 0900  . ENSURE (ENSURE) Take 237 mLs by mouth 3 (three) times daily between meals.   06/14/2015 at 0900  . furosemide (LASIX) 40 MG tablet Take 40 mg by mouth daily.   06/14/2015 at 0900  . losartan (COZAAR) 50 MG tablet Take 50 mg by mouth daily.   06/14/2015 at 0900  . metoprolol tartrate (LOPRESSOR) 25 MG  tablet Take 12.5 mg by mouth 2 (two) times daily.    06/14/2015 at 0900  . spironolactone (ALDACTONE) 25 MG tablet Take 25 mg by mouth daily.   06/14/2015 at 0900    Current medications: Current Facility-Administered Medications  Medication Dose Route Frequency Provider Last Rate Last Dose  . acetaminophen (TYLENOL) tablet 650 mg  650 mg Oral Q6H PRN Alford Highland, MD       Or  . acetaminophen (TYLENOL) suppository 650 mg  650 mg Rectal Q6H PRN Alford Highland, MD      . dextrose 5 % solution   Intravenous Continuous Houston Siren, MD 50 mL/hr at 06/18/15 [phone]    . glycopyrrolate (ROBINUL) injection 0.1 mg  0.1 mg Intravenous Q6H PRN Houston Siren, MD   0.1 mg at 06/17/15 1322      Allergies: No Known Allergies    Past Medical History: Past Medical History  Diagnosis Date  . CHF (congestive heart failure) (HCC)   . Hypertension   . Renal disorder     Chronic Kidney Disease  . Alzheimer disease   . Dementia   . S/P coronary artery stent placement 2006  . Coronary artery disease     PCI and stent to LAD in 2005     Past Surgical History:  Past Surgical History  Procedure Laterality Date  . Coronary angioplasty    . Colon surgery       Family History: Family History  Problem Relation Age of Onset  . Alzheimer's disease Mother      Social History: Social History   Social History  . Marital Status: Divorced    Spouse Name: N/A  . Number of Children: N/A  . Years of Education: N/A   Occupational History  . Not on file.   Social History Main Topics  . Smoking status: Former Smoker    Quit date: 07/28/1978  . Smokeless tobacco: Not on file  . Alcohol Use: No  . Drug Use: Not on file  . Sexual Activity: Not on file   Other Topics Concern  . Not on file   Social History Narrative     Review of Systems: Unavailable as patient is lethargic and is not able to provide any history  Vital Signs: Blood pressure 118/48, pulse 36, temperature 98.3 F  (36.8 C), temperature source Oral, resp. rate 16, height 5' (1.524 m), weight 63.64 kg (140 lb 4.8 oz), SpO2 100 %.   Intake/Output Summary (Last 24 hours) at 06/18/15 1447 Last data filed at 06/18/15 1116  Gross per 24 hour  Intake  956.3 ml  Output      0 ml  Net  956.3 ml    Weight trends: Filed Weights   06/14/15 1417 06/14/15 2116  Weight: 57.516 kg (126 lb 12.8 oz) 63.64 kg (140 lb 4.8 oz)    Physical Exam: General:  elderly, frail,  HEENT Moist oral mucous membranes,  Neck:  supple  Lungs: Shallow respiratory effort, decreased breath sounds at bases  Heart::  irregular rhythm  Abdomen: Soft, doughy consistency  Extremities:  ++ dependent edema over thighs and arms  Neurologic: Lethargic, opens eyes to sound, did not follow any commands  Skin: Normal turgor, no acute rashes  Access:   Foley:        Lab results: Basic Metabolic Panel:  Recent Labs Lab 06/16/15 0626 06/17/15 0443 06/18/15 0530  NA 154* 148* 146*  K 3.9 3.9 3.8  CL 123* 119* 116*  CO2 24 22 22   GLUCOSE 102* 102* 96  BUN 67* 69* 67*  CREATININE 2.34* 2.84* 2.93*  CALCIUM 9.1 8.7* 8.7*    Liver Function Tests:  Recent Labs Lab 06/14/15 1706  AST 59*  ALT 47  ALKPHOS 102  BILITOT 1.8*  PROT 7.8  ALBUMIN 3.2*    Recent Labs Lab 06/14/15 1706  LIPASE 45    Recent Labs Lab 06/18/15 1259  AMMONIA 80*    CBC:  Recent Labs Lab 06/14/15 1706 06/15/15 0452  WBC 3.5* 3.1*  NEUTROABS 3.1  --   HGB 10.3* 10.6*  HCT 34.8* 34.5*  MCV 86.8 87.1  PLT 45* 49*    Cardiac Enzymes:  Recent Labs Lab 06/14/15 1706  TROPONINI 0.11*    BNP: Invalid input(s): POCBNP  CBG: No results for input(s): GLUCAP in the last 168 hours.  Microbiology: Recent Results (from the past 720 hour(s))  Blood culture (single)     Status: None (Preliminary result)   Collection Time: 06/14/15  4:01 PM  Result Value Ref Range Status   Specimen Description BLOOD LEFT AC  Final   Special  Requests BOTTLES DRAWN AEROBIC AND ANAEROBIC  1CC  Final   Culture NO GROWTH 3 DAYS  Final   Report Status PENDING  Incomplete  Urine culture  Status: None   Collection Time: 06/14/15  4:34 PM  Result Value Ref Range Status   Specimen Description URINE, CATHETERIZED  Final   Special Requests NONE  Final   Culture 5,000 COLONIES/mL GRAM POSITIVE RODS  Final   Report Status 06/18/2015 FINAL  Final     Coagulation Studies: No results for input(s): LABPROT, INR in the last 72 hours.  Urinalysis: No results for input(s): COLORURINE, LABSPEC, PHURINE, GLUCOSEU, HGBUR, BILIRUBINUR, KETONESUR, PROTEINUR, UROBILINOGEN, NITRITE, LEUKOCYTESUR in the last 72 hours.  Invalid input(s): APPERANCEUR   HEMODIALYSIS FLOWSHEET:      Imaging: Dg Chest 2 View  06/16/2015  CLINICAL DATA:  Crackles.  Unresponsive. EXAM: CHEST  2 VIEW COMPARISON:  06/14/2015 FINDINGS: There is a moderate right pleural effusion. There is a small left pleural effusion. There is bilateral lower lobe airspace disease which may reflect atelectasis, unchanged from 06/14/2015. There is no pneumothorax. There is stable cardiomegaly. There is thoracic aortic atherosclerosis. There is no acute osseous abnormality. IMPRESSION: Findings most compatible live CHF. Electronically Signed   By: Elige Ko   On: 06/16/2015 19:06      Assessment & Plan: Pt is a 81 y.o. yo female with medical problems of dementia, chronic systolic congestive heart failure with EF 45-50%, moderate mitral regurgitation, dilated right ventricle, reduced right ventricular systolic function, severe tricuspid regurgitation, hypertension, chronic kidney disease, coronary disease with stent/PCN 2005,, who was admitted from nursing home to [facility] on 06/14/2015 for evaluation of severe hyponatremia, acute renal failure and acute encephalopathy.  1.  Acute renal failure on chronic kidney disease stage III Baseline creatinine Is 1. 45/GFR of 35 Acute renal failure  is likely secondary to ATN from volume depletion Patient was getting furosemide, Cozaar, spironolactone, metoprolol as outpatient Agree with stopping all antihypertensives and diuretics  2.  Severe hyponatremia Improving with IV D5W supplementation  3.  Altered mental status Cause is not clear Possibly related to high ammonia level So far urinalysis is negative for urine culture, blood cultures are negative, chest x-ray shows pleural effusions but no overt pneumonia.  No fever.  4.  Thrombocytopenia ? Related to liver disease as ammonia is also noted to be high  Plan: Will obtain abdominal ultrasound Patient may need NG tube as family was interested in continuing aggressive care and providing nutrition the patient

## 2015-06-18 NOTE — Progress Notes (Signed)
Visit made. Patient just back from abdominal ultrasound ordered d/t suspected liver disease, ammonia level elevated, renal function worsening. Patient seen lying in bed, no response to voice or gentle touch. She remains NPO. Son Ed not present at time of visit. Patient remains on IVF, full code. Will continue to follow and update hospice team. [facility] Raiford NobleRick aware that [facility]. Thank you. Dayna BarkerKaren Robertson RN, BSN, [facility] [place], [facility] [phone] c

## 2015-06-18 NOTE — Progress Notes (Signed)
[facility] -  at [facility]   PATIENT NAME: Kimberly EwingMaggie Acosta    MR#:  [id number]  DATE OF BIRTH:  [birth year]  SUBJECTIVE:   Patient here due to altered mental status and noted to be severely dehydrated and in acute renal failure and also severely hypernatremic.  Son at bedside  Sodium improving.  Renal function not much improved since admission.  Remains encephalopathic.  REVIEW OF SYSTEMS:    Review of Systems  Unable to perform ROS: mental acuity   Nutrition: NPO Tolerating Diet: NPO Tolerating PT: Await eval once mental status improves   DRUG ALLERGIES:  No Known Allergies  VITALS:  Blood pressure 112/57, pulse 58, temperature 97.6 F (36.4 C), temperature source Oral, resp. rate 16, height 5' (1.524 m), weight 63.64 kg (140 lb 4.8 oz), SpO2 100 %.  PHYSICAL EXAMINATION:   Physical Exam  GENERAL:  81 y.o.-year-old patient lying in the bed encephalopathic in NAD.  EYES: Pupils equal, round, reactive to light and accommodation. No scleral icterus. Extraocular muscles intact.  HEENT: Head atraumatic, normocephalic. Oropharynx and nasopharynx clear. Pooling of sputum in the mouth.  NECK:  Supple, no jugular venous distention. No thyroid enlargement, no tenderness.  LUNGS: Poor Resp. effort, no wheezing, rales, rhonchi. No use of accessory muscles of respiration.  CARDIOVASCULAR: S1, S2 normal. No murmurs, rubs, or gallops.  ABDOMEN: Soft, nontender, nondistended. Bowel sounds present. No organomegaly or mass.  EXTREMITIES: No cyanosis, clubbing or edema b/l.    NEUROLOGIC: Encephalopathic and difficult to do a good Neurological exam.  PSYCHIATRIC: encephalopathic.  SKIN: No obvious rash, lesion, or ulcer.    LABORATORY PANEL:   CBC  Recent Labs Lab 06/15/15 0452  WBC 3.1*  HGB 10.6*  HCT 34.5*  PLT 49*    ------------------------------------------------------------------------------------------------------------------  Chemistries   Recent Labs Lab 06/14/15 1706  06/18/15 0530  NA 167*  < > 146*  K 4.8  < > 3.8  CL >130*  < > 116*  CO2 28  < > 22  GLUCOSE 92  < > 96  BUN 83*  < > 67*  CREATININE 2.40*  < > 2.93*  CALCIUM 10.2  < > 8.7*  AST 59*  --   --   ALT 47  --   --   ALKPHOS 102  --   --   BILITOT 1.8*  --   --   < > = values in this interval not displayed. ------------------------------------------------------------------------------------------------------------------  Cardiac Enzymes  Recent Labs Lab 06/14/15 1706  TROPONINI 0.11*   ------------------------------------------------------------------------------------------------------------------  RADIOLOGY:  Dg Chest 2 View  06/16/2015  CLINICAL DATA:  Crackles.  Unresponsive. EXAM: CHEST  2 VIEW COMPARISON:  06/14/2015 FINDINGS: There is a moderate right pleural effusion. There is a small left pleural effusion. There is bilateral lower lobe airspace disease which may reflect atelectasis, unchanged from 06/14/2015. There is no pneumothorax. There is stable cardiomegaly. There is thoracic aortic atherosclerosis. There is no acute osseous abnormality. IMPRESSION: Findings most compatible live CHF. Electronically Signed   By: Elige KoHetal  Patel   On: 06/16/2015 19:06     ASSESSMENT AND PLAN:   81 year old female with past medical history of dementia, history of CHF, coronary artery disease, hypertension who presented to the hospital due to altered mental status and noted to be severely hypernatremic and dehydrated  #1 altered mental status-this is likely metabolic encephalopathy due to the acute renal failure, severe hypernatremia - sodium has improved but pt's mental status has not.  Renal  function is still poor but unlikely this is the cause of her encephalopathy.  - I will check Ammonia, ABG today.   - ?? If this  is advanced dementia and this would be a bad prognostic sign for the patient.   #2 Hypernatremia - cont. D5W and sodium improving.   #3 ARF - likely due to dehydration and poor PO intake and concomitant use of diuretics.  - cont. IV fluids and renal function slow to improve.  Will get Nephro consult and discussed w/ Dr. Thedore Mins.   #4 hx of CHF - CXR 11/19 showing some mild volume overload and therefore given one dose of lasix yesterday a.m.  Will dose diuretics on PRN basis for now.  - Hold metoprolol  #5 Thrombocytopenia - chronic in nature and follow platelet count. -No acute bleeding.  Await Palliative Care consult to discuss goals of care with family. She is already followed by Hospice.  She would probably benefit from Hospice home.   All the records are reviewed and case discussed with Care Management/Social Workerr. Management plans discussed with the patient, family and they are in agreement.  CODE STATUS: Full  DVT Prophylaxis: Teds and SCDs  TOTAL TIME TAKING CARE OF THIS PATIENT: 35 minutes.   POSSIBLE D/C IN 2-3 DAYS, DEPENDING ON CLINICAL CONDITION.   Houston Siren M.D on 06/18/2015 at 10:29 AM  Between 7am to 6pm - Pager - [phone]  After 6pm go to [email or web address] - password [email or web address] [facility]  [place] [facility]  Office  [phone]  CC: Primary care physician; [facility]

## 2015-06-19 DIAGNOSIS — E860 Dehydration: Secondary | ICD-10-CM

## 2015-06-19 DIAGNOSIS — E46 Unspecified protein-calorie malnutrition: Secondary | ICD-10-CM

## 2015-06-19 DIAGNOSIS — N189 Chronic kidney disease, unspecified: Secondary | ICD-10-CM

## 2015-06-19 DIAGNOSIS — G309 Alzheimer's disease, unspecified: Secondary | ICD-10-CM

## 2015-06-19 DIAGNOSIS — I2510 Atherosclerotic heart disease of native coronary artery without angina pectoris: Secondary | ICD-10-CM

## 2015-06-19 DIAGNOSIS — F0390 Unspecified dementia without behavioral disturbance: Secondary | ICD-10-CM

## 2015-06-19 DIAGNOSIS — F0280 Dementia in other diseases classified elsewhere without behavioral disturbance: Secondary | ICD-10-CM

## 2015-06-19 DIAGNOSIS — I509 Heart failure, unspecified: Secondary | ICD-10-CM

## 2015-06-19 DIAGNOSIS — G9341 Metabolic encephalopathy: Secondary | ICD-10-CM

## 2015-06-19 DIAGNOSIS — I129 Hypertensive chronic kidney disease with stage 1 through stage 4 chronic kidney disease, or unspecified chronic kidney disease: Secondary | ICD-10-CM

## 2015-06-19 DIAGNOSIS — N179 Acute kidney failure, unspecified: Secondary | ICD-10-CM

## 2015-06-19 DIAGNOSIS — R1310 Dysphagia, unspecified: Secondary | ICD-10-CM

## 2015-06-19 DIAGNOSIS — Z515 Encounter for palliative care: Secondary | ICD-10-CM

## 2015-06-19 LAB — BASIC METABOLIC PANEL
ANION GAP: 7 (ref 5–15)
BUN: 63 mg/dL — ABNORMAL HIGH (ref 6–20)
CALCIUM: 8.6 mg/dL — AB (ref 8.9–10.3)
CO2: 22 mmol/L (ref 22–32)
Chloride: 115 mmol/L — ABNORMAL HIGH (ref 101–111)
Creatinine, Ser: 2.61 mg/dL — ABNORMAL HIGH (ref 0.44–1.00)
GFR, EST AFRICAN AMERICAN: 17 mL/min — AB (ref >60)
GFR, EST NON AFRICAN AMERICAN: 15 mL/min — AB (ref >60)
Glucose, Bld: 96 mg/dL (ref 65–99)
Potassium: 3.6 mmol/L (ref 3.5–5.1)
SODIUM: 144 mmol/L (ref 135–145)

## 2015-06-19 MED ORDER — CETYLPYRIDINIUM CHLORIDE 0.05 % MT LIQD
7.0000 mL | Freq: Two times a day (BID) | OROMUCOSAL | Status: DC
  Administered 2015-06-19 – 2015-06-25 (×11): 7 mL via OROMUCOSAL

## 2015-06-19 MED ORDER — CHLORHEXIDINE GLUCONATE 0.12 % MT SOLN
15.0000 mL | Freq: Two times a day (BID) | OROMUCOSAL | Status: DC
  Administered 2015-06-20 – 2015-06-25 (×12): 15 mL via OROMUCOSAL
  Filled 2015-06-19 (×10): qty 15

## 2015-06-19 NOTE — Progress Notes (Signed)
Visit made. Writer spoke with patient's son Ed in the hallway as he was leaving. He reported that his mother "was awake and alert and talking". He remains hopeful that his mother will continue to improve and is also resistant to talk about any other possibility. He is hopeful that the speech evaluation will be performed today so that his mother will be able to start eating. Emotional support offered. Patient seen lying in bed, eyes closed. She did rouse to voice and gentle touch, said a few words, no coherent sentences. Some secretions runnnig from the corner of her mouth, wiped clean and dry wash cloth placed under her chin. Writer spoke with staff RN Gladstone LighterAlecia, she reports that patient appears to not be swallowing her own saliva, she does bite on a mouth swab. Kidney function remains outside her baseline. Palliative Care to engage with Ed today. Will continue to follow and update hospice team.  Dayna BarkerKaren Robertson RN, BSN, [facility] [place] [facility] [place], [facility]iaison [phone] c

## 2015-06-19 NOTE — Progress Notes (Signed)
[facility] - [place] at [facility]   PATIENT NAME: Kimberly Acosta    MR#:  [id number]  DATE OF BIRTH:  1919-12-24  SUBJECTIVE:   Bit more awake today and follows some simple commands.  Sodium level improved.  Cr. About the same.  Son at bedside.    REVIEW OF SYSTEMS:    Review of Systems  Unable to perform ROS: mental acuity   Nutrition: NPO and will get speech eval today.  Tolerating Diet: NPO Tolerating PT: Await eval    DRUG ALLERGIES:  No Known Allergies  VITALS:  Blood pressure 119/50, pulse 58, temperature 97.5 F (36.4 C), temperature source Oral, resp. rate 14, height 5' (1.524 m), weight 63.64 kg (140 lb 4.8 oz), SpO2 100 %.  PHYSICAL EXAMINATION:   Physical Exam  GENERAL:  81 y.o.-year-old patient lying in the bed lethargic but opens eyes to verbal commands.  EYES: Pupils equal, round, reactive to light and accommodation. No scleral icterus. Extraocular muscles intact.  HEENT: Head atraumatic, normocephalic. Oropharynx and nasopharynx clear. Pooling of sputum in the mouth.  NECK:  Supple, no jugular venous distention. No thyroid enlargement, no tenderness.  LUNGS: Poor Resp. effort, no wheezing, rales, rhonchi. No use of accessory muscles of respiration.  CARDIOVASCULAR: S1, S2 normal. No murmurs, rubs, or gallops.  ABDOMEN: Soft, nontender, nondistended. Bowel sounds present. No organomegaly or mass.  EXTREMITIES: No cyanosis, clubbing or edema b/l.    NEUROLOGIC: Encephalopathic and globally weak.  Follows some simple commands today.  PSYCHIATRIC: difficult to assess.  SKIN: No obvious rash, lesion, or ulcer.    LABORATORY PANEL:   CBC  Recent Labs Lab 06/15/15 0452  WBC 3.1*  HGB 10.6*  HCT 34.5*  PLT 49*   ------------------------------------------------------------------------------------------------------------------  Chemistries   Recent Labs Lab 06/14/15 1706  06/19/15 0451  NA 167*  < > 144  K 4.8  < >  3.6  CL >130*  < > 115*  CO2 28  < > 22  GLUCOSE 92  < > 96  BUN 83*  < > 63*  CREATININE 2.40*  < > 2.61*  CALCIUM 10.2  < > 8.6*  AST 59*  --   --   ALT 47  --   --   ALKPHOS 102  --   --   BILITOT 1.8*  --   --   < > = values in this interval not displayed. ------------------------------------------------------------------------------------------------------------------  Cardiac Enzymes  Recent Labs Lab 06/14/15 1706  TROPONINI 0.11*   ------------------------------------------------------------------------------------------------------------------  RADIOLOGY:  US Abdomen Complete  06/18/2015  CLINICAL DATA:  Acute renal failure low platelets. Suspected liver disease. EXAM: ULTRASOUND ABDOMEN COMPLETE COMPARISON:  03/30/2015 chest CT FINDINGS: Gallbladder: No gallstones or wall thickening visualized. Possible sludge. No sonographic Murphy sign noted. Common bile duct: Diameter: 4 mm Liver: Cystic lesion anteriorly in the right hepatic lobe 1.9 by 1.3 by 2.0 cm with enhanced through transmission. Within normal limits in parenchymal echogenicity. IVC: No abnormality visualized. Pancreas: Visualized portion unremarkable. Dorsal pancreatic duct upper normal size at 2 mm Spleen: Size and appearance within normal limits. Right Kidney: Length: 9.1 cm. Echogenicity within normal limits. No mass or hydronephrosis visualized. Left Kidney: Length: 9.7 cm. Echogenicity within normal limits. No mass or hydronephrosis visualized. Abdominal aorta: No aneurysm visualized. Bifurcation not seen due to overlying bowel gas. Other findings: Trace ascites in the right upper quadrant and left upper quadrant. Bilateral pleural effusions. IMPRESSION: 1. Possible sludge in the gallbladder but no gallstones or  directly demonstrated choledocholithiasis. No biliary dilatation. 2. Right hepatic lobe cyst. 3. Trace upper abdominal ascites.  Bilateral pleural effusions. 4. No hydronephrosis or stones identified. 5.  Atherosclerotic calcification of the abdominal aorta, without visualized aneurysm. Electronically Signed   By: Gaylyn RongWalter  Liebkemann M.D.   On: 06/18/2015 16:44     ASSESSMENT AND PLAN:   81 year old female with past medical history of dementia, history of CHF, coronary artery disease, hypertension who presented to the hospital due to altered mental status and noted to be severely hypernatremic and dehydrated  #1 altered mental status-this is likely metabolic encephalopathy due to the acute renal failure, severe hypernatremia -improved as the sodium has corrected.  Still lethargic but follows simple commands which is likely her baseline.  - ABG yesterday normal.  Ammonia mildly elevated but no hx of Liver disease.  Hold Lactulose for now as pt. Is improving.   #2 Hypernatremia - cont. D5W and sodium normalized now.  #3 acute on chronic renal failure - likely due to dehydration and poor PO intake and concomitant use of diuretics.  - cont. IV fluids and renal function slow to improve.   - appreciate Nephro input and likely ATN from volume depletion.  Renal US normal.  No plans for aggressive intervention at this time.   #4 hx of CHF - CXR 11/19 showing some mild volume overload and therefore given one dose of lasix - Will dose diuretics on PRN basis for now.  - Hold metoprolol, clinically stable.   #5 Thrombocytopenia - chronic in nature and follow platelet count. -No acute bleeding.  Appreciate palliative care input. Patient likely has a very poor prognosis given her advanced dementia. Discussed in detail with the son and recommended comfort care/hospice services but the son is not ready to make a decision and will continue to address this with the help of Palliative Care.   All the records are reviewed and case discussed with Care Management/Social Workerr. Management plans discussed with the patient, family and they are in agreement.  CODE STATUS: Full  DVT Prophylaxis: Teds and  SCDs  TOTAL TIME TAKING CARE OF THIS PATIENT: 35 minutes.   POSSIBLE D/C IN 2-3 DAYS, DEPENDING ON CLINICAL CONDITION.  Greater than 50% of time spent in coordination of care in talking with the patient's son, palliative care attending.   Houston SirenSAINANI,Deontre Allsup J M.D on 06/19/2015 at 3:06 PM  Between 7am to 6pm - Pager - [phone]  After 6pm go to [email or web address] - password [email or web address]  [facility]  Office  [phone]  CC: Primary care physician; [facility]

## 2015-06-19 NOTE — Progress Notes (Signed)
Subjective:  Patient's son is at bedside She is more alert today as compared to yesterday Did follow a few simple commands   Objective:  Vital signs in last 24 hours:  Temp:  [97.4 F (36.3 C)-98.5 F (36.9 C)] 98.5 F (36.9 C) (11/22 0918) Pulse Rate:  [36-106] 55 (11/22 0918) Resp:  [12-17] 12 (11/22 0918) BP: (109-125)/(43-69) 118/63 mmHg (11/22 0918) SpO2:  [98 %-100 %] 100 % (11/22 0918)  Weight change:  Filed Weights   06/14/15 1417 06/14/15 2116  Weight: 57.516 kg (126 lb 12.8 oz) 63.64 kg (140 lb 4.8 oz)    Intake/Output:    Intake/Output Summary (Last 24 hours) at 06/19/15 1101 Last data filed at 06/19/15 0732  Gross per 24 hour  Intake 1189.13 ml  Output      3 ml  Net 1186.13 ml    HEMODIALYSIS FLOW SHEET:      Physical Exam: General:  elderly, frail   HEENT  moist oral mucous membranes   Neck  supple   Pulm/lungs  shallow breathing effort, clear anteriorly and laterally   CVS/Heart  irregular rhythm, soft systolic murmur present   Abdomen:   soft, nontender, nondistended   Extremities:  2-3 + dependent peripheral edema especially thighs and lower abdomen   Neurologic:  more alert compared to yesterday, follows some simple commands   Skin:  no acute rashes   Access:        Basic Metabolic Panel:   Recent Labs Lab 06/15/15 0452 06/16/15 0626 06/17/15 0443 06/18/15 0530 06/19/15 0451  NA 165* 154* 148* 146* 144  K 4.6 3.9 3.9 3.8 3.6  CL >130* 123* 119* 116* 115*  CO2 GLUCOSE 128* 102* 102* 96 96  BUN 81* 67* 69* 67* 63*  CREATININE 2.26* 2.34* 2.84* 2.93* 2.61*  CALCIUM 9.6 9.1 8.7* 8.7* 8.6*     CBC:  Recent Labs Lab 06/14/15 1706 06/15/15 0452  WBC 3.5* 3.1*  NEUTROABS 3.1  --   HGB 10.3* 10.6*  HCT 34.8* 34.5*  MCV 86.8 87.1  PLT 45* 49*      Microbiology:  Recent Results (from the past 720 hour(s))  Blood culture (single)     Status: None (Preliminary result)   Collection Time: 06/14/15  4:01  PM  Result Value Ref Range Status   Specimen Description BLOOD LEFT AC  Final   Special Requests BOTTLES DRAWN AEROBIC AND ANAEROBIC  1CC  Final   Culture NO GROWTH 3 DAYS  Final   Report Status PENDING  Incomplete  Urine culture     Status: None   Collection Time: 06/14/15  4:34 PM  Result Value Ref Range Status   Specimen Description URINE, CATHETERIZED  Final   Special Requests NONE  Final   Culture 5,000 COLONIES/mL GRAM POSITIVE RODS  Final   Report Status 06/18/2015 FINAL  Final    Coagulation Studies: No results for input(s): LABPROT, INR in the last 72 hours.  Urinalysis: No results for input(s): COLORURINE, LABSPEC, PHURINE, GLUCOSEU, HGBUR, BILIRUBINUR, KETONESUR, PROTEINUR, UROBILINOGEN, NITRITE, LEUKOCYTESUR in the last 72 hours.  Invalid input(s): APPERANCEUR    Imaging: US Abdomen Complete  06/18/2015  CLINICAL DATA:  Acute renal failure low platelets. Suspected liver disease. EXAM: ULTRASOUND ABDOMEN COMPLETE COMPARISON:  03/30/2015 chest CT FINDINGS: Gallbladder: No gallstones or wall thickening visualized. Possible sludge. No sonographic Murphy sign noted. Common bile duct: Diameter: 4 mm Liver: Cystic lesion anteriorly in the right hepatic lobe 1.9 by  1.3 by 2.0 cm with enhanced through transmission. Within normal limits in parenchymal echogenicity. IVC: No abnormality visualized. Pancreas: Visualized portion unremarkable. Dorsal pancreatic duct upper normal size at 2 mm Spleen: Size and appearance within normal limits. Right Kidney: Length: 9.1 cm. Echogenicity within normal limits. No mass or hydronephrosis visualized. Left Kidney: Length: 9.7 cm. Echogenicity within normal limits. No mass or hydronephrosis visualized. Abdominal aorta: No aneurysm visualized. Bifurcation not seen due to overlying bowel gas. Other findings: Trace ascites in the right upper quadrant and left upper quadrant. Bilateral pleural effusions. IMPRESSION: 1. Possible sludge in the gallbladder but  no gallstones or directly demonstrated choledocholithiasis. No biliary dilatation. 2. Right hepatic lobe cyst. 3. Trace upper abdominal ascites.  Bilateral pleural effusions. 4. No hydronephrosis or stones identified. 5. Atherosclerotic calcification of the abdominal aorta, without visualized aneurysm. Electronically Signed   By: Gaylyn RongWalter  Liebkemann M.D.   On: 06/18/2015 16:44     Medications:   . dextrose 50 mL/hr at 06/19/15 0400     acetaminophen **OR** acetaminophen, glycopyrrolate  Assessment/ Plan:  81 y.o. African-American female female with medical problems of dementia, chronic systolic congestive heart failure with EF 45-50%, moderate mitral regurgitation, dilated right ventricle, reduced right ventricular systolic function, severe tricuspid regurgitation, hypertension, chronic kidney disease, coronary disease with stent/PCN 2005,, who was [facility] from nursing home [facility]14[facility]2015 for evaluation of severe hyponatremia, acute renal failure and acute encephalopathy.  1. Acute renal failure on chronic kidney disease stage III Baseline creatinine Is 1. 45/GFR of 35 Acute renal failure is likely secondary to ATN from volume depletion. Renal U/S neg for obstruction Patient was getting furosemide, Cozaar, spironolactone, metoprolol as outpatient Agree with stopping all antihypertensives and diuretics  2. Severe hyponatremia Improving with IV D5W supplementation  3. Altered mental status Cause is not clear, but likely severe dehydration Possibly related to high ammonia level So far urinalysis is negative for urine culture, blood cultures are negative, chest x-ray shows pleural effusions but no overt pneumonia. No fever.      LOS: 5 Clois Treanor 11/22/201611:01 AM

## 2015-06-19 NOTE — Clinical Social Work Note (Signed)
Clinical Social Work Assessment  Patient Details  Name: Kimberly EwingMaggie Acosta MRN: [id number] Date of Birth: 24-Oct-1919  Date of referral:  06/19/15               Reason for consult:  Facility Placement                Permission sought to share information with:   (patient with advanced dementia; her son is her emergency contact) Permission granted to share information::     Name::        Agency::     Relationship::     Contact Information:     Housing/Transportation Living arrangements for the past 2 months:  Assisted Living Facility Source of Information:  Facility Patient Interpreter Needed:  None Criminal Activity/Legal Involvement Pertinent to Current Situation/Hospitalization:  No - Comment as needed Significant Relationships:  Adult Children Lives with:  Facility Resident Do you feel safe going back to the place where you live?  Yes Need for family participation in patient care:  Yes (Comment)  Care giving concerns:  [facility].  Social Worker assessment / plan:  CSW spoke with patient's son Patriciaann Clan(Edward Majeed: [phone]) at patient's bedside this morning. Patient's son expressed that he knows that he will need to make some hard decisions regarding the care of his mother in the upcoming days. Patient's son stated that he doesn't want to be "too aggressive" with her care but does want to give her every opportunity he can. Patient's son confirmed [facility] is [facility] and he wishes for patient to return there at discharge with the hospice services she has in place through [facility]. [facility] to determine if they will be able to take patient back.  Employment status:  Retired Database administratornsurance information:  Managed Medicare PT Recommendations:  Not assessed at this time Information / Referral to community resources:     Patient/Family's Response to care:  Patient's son pleased with care provided here in the  hospital.  Patient/Family's Understanding of and Emotional Response to Diagnosis, Current Treatment, and Prognosis:  Patient's son admits that he knows that difficult decisions are ahead for him.  Emotional Assessment Appearance:  Appears younger than stated age Attitude/Demeanor/Rapport:   (quiet and confused) Affect (typically observed):  Calm Orientation:  Oriented to Self Alcohol / Substance use:  Not Applicable Psych involvement (Current and /or in the community):  No (Comment)  Discharge Needs  Concerns to be addressed:  Care Coordination Readmission within the last 30 days:  No Current discharge risk:  None Barriers to Discharge:  No Barriers Identified   York SpanielMonica Jafeth Mustin, LCSW 06/19/2015, 12:12 PM

## 2015-06-19 NOTE — Evaluation (Signed)
Clinical/Bedside Swallow Evaluation Patient Details  Name: Kimberly Acosta MRN: [id number] Date of Birth: 03-05-1920  Today's Date: 06/19/2015 Time: SLP Start Time (ACUTE ONLY): 1401 SLP Stop Time (ACUTE ONLY): 1500 SLP Time Calculation (min) (ACUTE ONLY): 59 min  Past Medical History:  Past Medical History  Diagnosis Date  . CHF (congestive heart failure) (HCC)   . Hypertension   . Renal disorder     Chronic Kidney Disease  . Alzheimer disease   . Dementia   . S/P coronary artery stent placement 2006  . Coronary artery disease     PCI and stent to LAD in 2005   Past Surgical History:  Past Surgical History  Procedure Laterality Date  . Coronary angioplasty    . Colon surgery     HPI:  Pt 81 year old female with past medical history of dementia, history of CHF, coronary artery disease, hypertension who presented to the hospital due to altered mental status and noted to be severely hypernatremic and dehydrated. Pt is a little more awake at times today w/ mumbled speech; appears to be interacting some w/ family and others. This may be close to her baseline per MD. She continues to be drowsy and easily falls asleep. Pt has been NPO sec. to her declined alertness to safely take po's. Palliative Care is following her. Sodium level is improving per MD note; baseline Dementia likely exacerbated by the illness.   Assessment / Plan / Recommendation Clinical Impression  Pt appears at increased risk for aspiration sec. to her declined Cognitive awareness for the task of taking po's. Pt required mod.-max. cues to attend to task of taking the po's from the utensil then manipulate them in the mouth for A-P transfer for swallowing. Pt intermittently swallowed spontaneously but exhibited a delayed throat clear and cough x1 each w/ small amount of water(thin liquid) via tsp. Pt's pharyngeal swallows were not immediate and impacted by the oral phase deficits. Due to pt's increased risk for  aspiration at this time sec. to her declined Cognitive status and overall drowsiness, an oral diet is NOT rec'd at this time. Instead, pleasure trials of single ice chips and 1/2 tsp amounts of water given w/ strict aspiration precautions and to stop if any increased coughing or discomfort noted. ST will continue to f/u w/ pt's presentation and readiness for further po trials of foods/liquids hopefully to establish an oral diet, safely. This information was discussed w/ the Dtr-in-law in room during the session and agreed upon. MD/NSG updated. Sign posted re: po ice chips and tsps of water trials.     Aspiration Risk  Moderate aspiration risk    Diet Recommendation  NPO w/ pleasure ice chips and 1/2 tsps of water w/ strict aspiration precautions.   Medication Administration: Via alternative means    Other  Recommendations Recommended Consults:  (palliative care following) Oral Care Recommendations: Oral care QID;Staff/trained caregiver to provide oral care Other Recommendations:  (TBD)   Follow up Recommendations   (TBD)    Frequency and Duration min 3x week  1 week       Swallow Study   General Date of Onset: 06/14/15 HPI: Pt 81 year old female with past medical history of dementia, history of CHF, coronary artery disease, hypertension who presented to the hospital due to altered mental status and noted to be severely hypernatremic and dehydrated. Pt is a little more awake at times today w/ mumbled speech; appears to be interacting some w/ family and others. This may be close to her  baseline per MD. She continues to be drowsy and easily falls asleep. Pt has been NPO sec. to her declined alertness to safely take po's. Palliative Care is following her. Type of Study: Bedside Swallow Evaluation Previous Swallow Assessment: none indicated Diet Prior to this Study: Thin liquids (soft foods per family) Temperature Spikes Noted: No Respiratory Status: Nasal cannula (2 liters) History of Recent  Intubation: No Behavior/Cognition: Pleasant mood;Confused;Distractible;Requires cueing;Doesn't follow directions (awake) Oral Cavity Assessment: Within Functional Limits Oral Care Completed by SLP: Yes Oral Cavity - Dentition: Missing dentition Vision:  (total assist) Self-Feeding Abilities: Total assist Patient Positioning: Upright in bed Baseline Vocal Quality: Low vocal intensity (mumbled speech) Volitional Cough: Cognitively unable to elicit Volitional Swallow: Unable to elicit    Oral/Motor/Sensory Function Overall Oral Motor/Sensory Function:  (unable to formally assess sec. to declined Cognitive status)   Ice Chips Ice chips: Impaired Presentation: Spoon (fed; 5 trials) Oral Phase Impairments: Reduced labial seal;Reduced lingual movement/coordination;Poor awareness of bolus Oral Phase Functional Implications: Left anterior spillage;Right anterior spillage;Prolonged oral transit;Oral residue Pharyngeal Phase Impairments: Suspected delayed Swallow;Throat Clearing - Delayed (x1) Other Comments: mod-max. cues given   Thin Liquid Thin Liquid: Impaired Presentation: Spoon (fed; 4 trials) Oral Phase Impairments: Reduced labial seal;Reduced lingual movement/coordination;Poor awareness of bolus Oral Phase Functional Implications: Right anterior spillage;Left anterior spillage;Prolonged oral transit;Oral residue Pharyngeal  Phase Impairments: Suspected delayed Swallow;Cough - Delayed;Throat Clearing - Delayed (x1 each) Other Comments: mod-max. cues; easily fatigued w/ the few trials given/taken at session; eyes closed    Nectar Thick Nectar Thick Liquid: Not tested   Honey Thick Honey Thick Liquid: Not tested   Puree Puree: Not tested   Solid Solid: Not tested        Kimberly Som, MS, CCC-SLP  Kimberly Acosta 06/19/2015,4:35 PM

## 2015-06-19 NOTE — Progress Notes (Addendum)
Palliative Care Update   Note that I have visited patient, reviewed chart and data, and have spoken with several people in care management, social work, and also Hospice.  I have spoken with his attendings since she has been here also. I have not yet met the son, nor spoken with him.  Hospice is involved with pt and there have been attempts to engage the son in a discussion about patient's condition, prognosis, and goals of care, but these attempts have not changed his approach of wanting aggressive care and full code despite her being under the care of Hospice already).  It should be noted that the patient's son was approached by Hospice nursing staff about wishes regarding a feeding tube, if this became an issue.  He stated that he was 'not going to think about anything like that at this time.'  We all would like for him to complete a MOST form, but if he holds his current position about not 'thinking' about these matters, then this will not occur.    I will make an attempt to talk to him tomorrow.  It does not hurt for different people to give the same message to him about the natural course of life including the natural process of dying slowly.  Perhaps hearing it from different people --said in different ways, will result in him finally hearing this in his soul --eventually.  Perhaps not.    After examining the patient, the thought occurred to me that she looks to be younger than her age --much younger.  She has a peaceful look and her face is  without many wrinkles.  Perhaps this is part of the reason her son cannot envision her passing from him --probably ever.  He needs to hear how old her invisible systems are inside her --her immune system, her brain, etc.  Things he cannot see when he looks at her.  I will make my attempt, and others should continue as well.  I suspect that a non-medical approach on these subjects might connect better with him, but I do not know until we talk.    Full note to  follow.   Marland Kitchen

## 2015-06-19 NOTE — Progress Notes (Signed)
Nutrition Follow-up    INTERVENTION:  Coordination of care: May need to consider dobhoff placement with enteral nutrition, if aggressive care is wanted and unable to advance diet.    NUTRITION DIAGNOSIS:   Inadequate oral intake related to acute illness as evidenced by NPO status.    GOAL:   Patient will meet greater than or equal to 90% of their needs    MONITOR:    (Energy intake, Electrolyte and renal profile, Anthropometric)  REASON FOR ASSESSMENT:   Malnutrition Screening Tool    ASSESSMENT:      Palliative care following.   Current Nutrition: NPO day 5 of admission.  SLP following   Gastrointestinal Profile: Last BM: unknown    Continuous Medications:  . dextrose 50 mL/hr at 06/19/15 0400     Electrolyte/Renal Profile and Glucose Profile:   Recent Labs Lab 06/17/15 0443 06/18/15 0530 06/19/15 0451  NA 148* 146* 144  K 3.9 3.8 3.6  CL 119* 116* 115*  CO2 22 22 22   BUN 69* 67* 63*  CREATININE 2.84* 2.93* 2.61*  CALCIUM 8.7* 8.7* 8.6*  GLUCOSE 102* 96 96   Protein Profile:  Recent Labs Lab 06/14/15 1706  ALBUMIN 3.2*        Weight Trend since Admission: Filed Weights   06/14/15 1417 06/14/15 2116  Weight: 126 lb 12.8 oz (57.516 kg) 140 lb 4.8 oz (63.64 kg)      Diet Order:  Diet NPO time specified  Skin:   reivewed   Height:   Ht Readings from Last 1 Encounters:  06/14/15 5' (1.524 m)    Weight:   Wt Readings from Last 1 Encounters:  06/14/15 140 lb 4.8 oz (63.64 kg)    Ideal Body Weight:     BMI:  Body mass index is 27.4 kg/(m^2).  Estimated Nutritional Needs:   Kcal:  Using IBW of 45kg BEE 776 kcals (IF 1.1-1.3, AF 1.2) [id number] kcals/d  Protein:  (1.2-1.5 g/kg) 54-68 gm/d  Fluid:  (25-6130ml/kg) 1125-13150ml/d  EDUCATION NEEDS:   No education needs identified at this time  HIGH Care Level  Hattie Aguinaldo B. Freida BusmanAllen, RD, LDN [phone] (pager)

## 2015-06-20 LAB — BASIC METABOLIC PANEL
ANION GAP: 9 (ref 5–15)
BUN: 51 mg/dL — AB (ref 6–20)
CALCIUM: 8.7 mg/dL — AB (ref 8.9–10.3)
CO2: 22 mmol/L (ref 22–32)
Chloride: 113 mmol/L — ABNORMAL HIGH (ref 101–111)
Creatinine, Ser: 2.15 mg/dL — ABNORMAL HIGH (ref 0.44–1.00)
GFR calc Af Amer: 22 mL/min — ABNORMAL LOW (ref >60)
GFR, EST NON AFRICAN AMERICAN: 19 mL/min — AB (ref >60)
GLUCOSE: 92 mg/dL (ref 65–99)
Potassium: 3.5 mmol/L (ref 3.5–5.1)
SODIUM: 144 mmol/L (ref 135–145)

## 2015-06-20 LAB — CULTURE, BLOOD (SINGLE): CULTURE: NO GROWTH

## 2015-06-20 NOTE — Clinical Social Work Note (Signed)
[facility] CSW this afternoon to say that she had spoken to their Regional Director and that they wanted to hold a care team meeting with patient's son on Monday to determine if they could meet patient's needs. CSW informed Kimberly Acosta that patient may be ready for discharge prior to that time and that we needed a determination from them. Kimberly Acosta was to call CSW back once she spoke with her Regional Director again but as of this time has not called CSW. Kimberly Acosta MSW,LCSW [phone]

## 2015-06-20 NOTE — Progress Notes (Signed)
Patient ID: Kimberly Acosta, female   DOB: 1919-08-16, 81 y.o.   MRN: [id number] [facility] PROGRESS NOTE  PCP: [facility]  HPI/Subjective: Patient alert and able to answer some questions but she is difficult to understand.  Objective: Filed Vitals:   06/20/15 0926 06/20/15 1336  BP: 117/60 100/72  Pulse: 96 113  Temp: 97.8 F (36.6 C) 97.9 F (36.6 C)  Resp: 17 18    Filed Weights   06/14/15 1417 06/14/15 2116  Weight: 57.516 kg (126 lb 12.8 oz) 63.64 kg (140 lb 4.8 oz)    ROS: Review of Systems  Unable to perform ROS  Exam: Physical Exam  HENT:  Nose: No mucosal edema.  Mouth/Throat: No oropharyngeal exudate or posterior oropharyngeal edema.  Eyes: Conjunctivae, EOM and lids are normal. Pupils are equal, round, and reactive to light.  Neck: No JVD present. Carotid bruit is not present. No edema present. No thyroid mass and no thyromegaly present.  Cardiovascular: S1 normal and S2 normal.  Exam reveals no gallop.   No murmur heard. Pulses:      Dorsalis pedis pulses are 2+ on the right side, and 2+ on the left side.  Respiratory: No respiratory distress. She has no wheezes. She has no rhonchi. She has no rales.  GI: Soft. Bowel sounds are normal. There is no tenderness.  Musculoskeletal:       Right ankle: She exhibits swelling.       Left ankle: She exhibits swelling.  Lymphadenopathy:    She has no cervical adenopathy.  Neurological: She is alert.  Skin: Skin is warm. No rash noted. Nails show no clubbing.  Psychiatric: She has a normal mood and affect.    Data Reviewed: Basic Metabolic Panel:  Recent Labs Lab 06/16/15 0626 06/17/15 0443 06/18/15 0530 06/19/15 0451 06/20/15 0532  NA 154* 148* 146* 144 144  K 3.9 3.9 3.8 3.6 3.5  CL 123* 119* 116* 115* 113*  CO2 GLUCOSE 102* 102* 96 96 92  BUN 67* 69* 67* 63* 51*  CREATININE 2.34* 2.84* 2.93* 2.61* 2.15*  CALCIUM 9.1 8.7* 8.7* 8.6* 8.7*   Liver  Function Tests:  Recent Labs Lab 06/14/15 1706  AST 59*  ALT 47  ALKPHOS 102  BILITOT 1.8*  PROT 7.8  ALBUMIN 3.2*    Recent Labs Lab 06/14/15 1706  LIPASE 45    Recent Labs Lab 06/18/15 1259  AMMONIA 80*   CBC:  Recent Labs Lab 06/14/15 1706 06/15/15 0452  WBC 3.5* 3.1*  NEUTROABS 3.1  --   HGB 10.3* 10.6*  HCT 34.8* 34.5*  MCV 86.8 87.1  PLT 45* 49*   Cardiac Enzymes:  Recent Labs Lab 06/14/15 1706  TROPONINI 0.11*   BNP (last 3 results)  Recent Labs  03/29/15 1547 03/30/15 0416  BNP 1402.0* 1393.0*      Recent Results (from the past 240 hour(s))  Blood culture (single)     Status: None   Collection Time: 06/14/15  4:01 PM  Result Value Ref Range Status   Specimen Description BLOOD LEFT AC  Final   Special Requests BOTTLES DRAWN AEROBIC AND ANAEROBIC  1CC  Final   Culture NO GROWTH 6 DAYS  Final   Report Status 06/20/2015 FINAL  Final  Urine culture     Status: None   Collection Time: 06/14/15  4:34 PM  Result Value Ref Range Status   Specimen Description URINE, CATHETERIZED  Final   Special  Requests NONE  Final   Culture 5,000 COLONIES/mL GRAM POSITIVE RODS  Final   Report Status 06/18/2015 FINAL  Final     Studies: [place] Abdomen Complete  06/18/2015  CLINICAL DATA:  Acute renal failure low platelets. Suspected liver disease. EXAM: ULTRASOUND ABDOMEN COMPLETE COMPARISON:  03/30/2015 chest CT FINDINGS: Gallbladder: No gallstones or wall thickening visualized. Possible sludge. No sonographic Murphy sign noted. Common bile duct: Diameter: 4 mm Liver: Cystic lesion anteriorly in the right hepatic lobe 1.9 by 1.3 by 2.0 cm with enhanced through transmission. Within normal limits in parenchymal echogenicity. IVC: No abnormality visualized. Pancreas: Visualized portion unremarkable. Dorsal pancreatic duct upper normal size at 2 mm Spleen: Size and appearance within normal limits. Right Kidney: Length: 9.1 cm. Echogenicity within normal limits. No  mass or hydronephrosis visualized. Left Kidney: Length: 9.7 cm. Echogenicity within normal limits. No mass or hydronephrosis visualized. Abdominal aorta: No aneurysm visualized. Bifurcation not seen due to overlying bowel gas. Other findings: Trace ascites in the right upper quadrant and left upper quadrant. Bilateral pleural effusions. IMPRESSION: 1. Possible sludge in the gallbladder but no gallstones or directly demonstrated choledocholithiasis. No biliary dilatation. 2. Right hepatic lobe cyst. 3. Trace upper abdominal ascites.  Bilateral pleural effusions. 4. No hydronephrosis or stones identified. 5. Atherosclerotic calcification of the abdominal aorta, without visualized aneurysm. Electronically Signed   By: Gaylyn RongWalter  Liebkemann M.D.   On: 06/18/2015 16:44    Scheduled Meds: . antiseptic oral rinse  7 mL Mouth Rinse q12n4p  . chlorhexidine  15 mL Mouth Rinse BID   Continuous Infusions: . dextrose 50 mL/hr at 06/20/15 0106    Assessment/Plan:  1. Acute renal failure on chronic kidney disease- creatinine has improved to 2.15 with a GFR of 22. Continue IV fluid hydration. 2. Severe hypernatremia- improved from 167 down to 144 3. Acute encephalopathy- improved where she is trying to talk and answer some questions 4. Dementia without behavioral disturbance 5. Failure to thrive- patient's son is having a difficult the decision to decide on hospice care versus PEG tube placement. He is unable to make this decision at this point. I do not think the patient will keep up with her nutritional needs.  Code Status:     Code Status Orders        Start     Ordered   06/14/15 1943  Full code   Continuous     06/14/15 1944    Advance Directive Documentation        Most Recent Value   Type of Advance Directive  Healthcare Power of Attorney   Pre-existing out of facility DNR order (yellow form or pink MOST form)     "MOST" Form in Place?       Family Communication: Son at the  bedside Disposition Plan: To be determined  Consultants:  Nephrology  Palliative care  Time spent: 30 minutes, greater than 50% time spent in coordination of care and talking with the son about what to do next.  Alford HighlandWIETING, Carsynn Bethune  [facility]

## 2015-06-20 NOTE — Plan of Care (Signed)
Problem: Education: Goal: Knowledge of [place] [facility] Education information/materials will improve Outcome: Not Progressing Pt confused  Problem: Health Behavior/Discharge Planning: Goal: Ability to manage health-related needs will improve Outcome: Not Progressing Pt confused     

## 2015-06-20 NOTE — Progress Notes (Signed)
Visit made. Patient seen lying in bed, son Ed present. Patient did rouse to voice and answered simple questions.  She drifted easily back to sleep but roused again when spoken to. Ed has been feeding her ice chips today, which he did report she swallows some and "some run out of her mouth". Long discussion had regarding dementia and the changes that occur. He continues to focus on vital signs and that fact that his mother will open her mouth and take the ice chips. He was hopeful Speech Therapy would return and advance her diet.  Per chart note review [place] [name] has been in contact with [facility] [place] regarding patient's ability to return.  She will continue to be followed by hospice after discharge. Speech Therapist in at end of visit. Will continue to follow and update hospice team. Dayna BarkerKaren Robertson RN, BSN, [facility] [place], [facility] Lia[facility] [phone]

## 2015-06-20 NOTE — Progress Notes (Signed)
Subjective:  Patient's son is at bedside She is more alert today as compared to yesterday Now able to follow a few simple commands   Objective:  Vital signs in last 24 hours:  Temp:  [97.5 F (36.4 C)-99 F (37.2 C)] 97.8 F (36.6 C) (11/23 0926) Pulse Rate:  [58-108] 96 (11/23 0926) Resp:  [10-17] 17 (11/23 0926) BP: (104-119)/(45-60) 117/60 mmHg (11/23 0926) SpO2:  [100 %] 100 % (11/23 0926)  Weight change:  Filed Weights   06/14/15 1417 06/14/15 2116  Weight: 57.516 kg (126 lb 12.8 oz) 63.64 kg (140 lb 4.8 oz)    Intake/Output:    Intake/Output Summary (Last 24 hours) at 06/20/15 1231 Last data filed at 06/20/15 0800  Gross per 24 hour  Intake 1222.66 ml  Output    316 ml  Net 906.66 ml    HEMODIALYSIS FLOW SHEET:      Physical Exam: General:  elderly, frail   HEENT  moist oral mucous membranes   Neck  supple   Pulm/lungs  shallow breathing effort, clear anteriorly and laterally   CVS/Heart  irregular rhythm, soft systolic murmur present   Abdomen:   soft, nontender, nondistended   Extremities:  2-3 + dependent peripheral edema especially thighs and lower abdomen   Neurologic:  more alert compared to yesterday, follows some simple commands   Skin:  no acute rashes   Access:        Basic Metabolic Panel:   Recent Labs Lab 06/16/15 0626 06/17/15 0443 06/18/15 0530 06/19/15 0451 06/20/15 0532  NA 154* 148* 146* 144 144  K 3.9 3.9 3.8 3.6 3.5  CL 123* 119* 116* 115* 113*  CO2 24 22 22 22 22   GLUCOSE 102* 102* 96 96 92  BUN 67* 69* 67* 63* 51*  CREATININE 2.34* 2.84* 2.93* 2.61* 2.15*  CALCIUM 9.1 8.7* 8.7* 8.6* 8.7*     CBC:  Recent Labs Lab 06/14/15 1706 06/15/15 0452  WBC 3.5* 3.1*  NEUTROABS 3.1  --   HGB 10.3* 10.6*  HCT 34.8* 34.5*  MCV 86.8 87.1  PLT 45* 49*      Microbiology:  Recent Results (from the past 720 hour(s))  Blood culture (single)     Status: None   Collection Time: 06/14/15  4:01 PM  Result Value Ref  Range Status   Specimen Description BLOOD LEFT AC  Final   Special Requests BOTTLES DRAWN AEROBIC AND ANAEROBIC  1CC  Final   Culture NO GROWTH 6 DAYS  Final   Report Status 06/20/2015 FINAL  Final  Urine culture     Status: None   Collection Time: 06/14/15  4:34 PM  Result Value Ref Range Status   Specimen Description URINE, CATHETERIZED  Final   Special Requests NONE  Final   Culture 5,000 COLONIES/mL GRAM POSITIVE RODS  Final   Report Status 06/18/2015 FINAL  Final    Coagulation Studies: No results for input(s): LABPROT, INR in the last 72 hours.  Urinalysis: No results for input(s): COLORURINE, LABSPEC, PHURINE, GLUCOSEU, HGBUR, BILIRUBINUR, KETONESUR, PROTEINUR, UROBILINOGEN, NITRITE, LEUKOCYTESUR in the last 72 hours.  Invalid input(s): APPERANCEUR    Imaging: [place] Abdomen Complete  06/18/2015  CLINICAL DATA:  Acute renal failure low platelets. Suspected liver disease. EXAM: ULTRASOUND ABDOMEN COMPLETE COMPARISON:  03/30/2015 chest CT FINDINGS: Gallbladder: No gallstones or wall thickening visualized. Possible sludge. No sonographic Murphy sign noted. Common bile duct: Diameter: 4 mm Liver: Cystic lesion anteriorly in the right hepatic lobe 1.9 by 1.3 by  2.0 cm with enhanced through transmission. Within normal limits in parenchymal echogenicity. IVC: No abnormality visualized. Pancreas: Visualized portion unremarkable. Dorsal pancreatic duct upper normal size at 2 mm Spleen: Size and appearance within normal limits. Right Kidney: Length: 9.1 cm. Echogenicity within normal limits. No mass or hydronephrosis visualized. Left Kidney: Length: 9.7 cm. Echogenicity within normal limits. No mass or hydronephrosis visualized. Abdominal aorta: No aneurysm visualized. Bifurcation not seen due to overlying bowel gas. Other findings: Trace ascites in the right upper quadrant and left upper quadrant. Bilateral pleural effusions. IMPRESSION: 1. Possible sludge in the gallbladder but no gallstones or  directly demonstrated choledocholithiasis. No biliary dilatation. 2. Right hepatic lobe cyst. 3. Trace upper abdominal ascites.  Bilateral pleural effusions. 4. No hydronephrosis or stones identified. 5. Atherosclerotic calcification of the abdominal aorta, without visualized aneurysm. Electronically Signed   By: Gaylyn Rong M.D.   On: 06/18/2015 16:44     Medications:   . dextrose 50 mL/hr at 06/20/15 0106   . antiseptic oral rinse  7 mL Mouth Rinse q12n4p  . chlorhexidine  15 mL Mouth Rinse BID   acetaminophen **OR** acetaminophen, glycopyrrolate  Assessment/ Plan:  81 y.o. African-American female female with medical problems of dementia, chronic systolic congestive heart failure with EF 45-50%, moderate mitral regurgitation, dilated right ventricle, reduced right ventricular systolic function, severe tricuspid regurgitation, hypertension, chronic kidney disease, coronary disease with stent/PCN 2005,, who was [facility] from nursing home to [facility]2015 for evaluation of severe hyponatremia, acute renal failure and acute encephalopathy.  1. Acute renal failure on chronic kidney disease stage III Baseline creatinine Is 1. 45/GFR of 35 Acute renal failure is likely secondary to ATN from volume depletion. Renal U/S neg for obstruction Patient was getting furosemide, Cozaar, spironolactone, metoprolol as outpatient Agree with stopping all antihypertensives and diuretics Today's creatinine improved to 2.15  2. Severe hyponatremia Improving with IV D5W supplementation  3. Altered mental status Cause is not clear, but likely severe dehydration Possibly related to high ammonia level So far urinalysis is negative for urine culture, blood cultures are negative, chest x-ray shows pleural effusions but no overt pneumonia. No fever. Improving slowly   case discussed with patient's son    LOS: 6 Kimberly Acosta 11/23/201612:31 PM

## 2015-06-20 NOTE — Progress Notes (Signed)
Palliative Care Update  I waited for patient's son to arrive in the evening.  He, his wife, and I went to the Family Room and talked for an hour from 6:30 pm till 7:30 pm.  I felt that the conversation was productive, but no specific changes in patient's care was desired by son.   He related that he wishes to continue Full Code status. He seems to think that he can only accept it if her kidneys or her heart or some internal organ fails (somewhat acutely and suddenly apparently).  He did not indicate that he would not be bringing her back when this same scenario repeats itself very soon.  However, I do think the son left our meeting with a better uderstanding of dysphagia and end stage dementia and its natural course.    Full note to follow.  Suan HalterMargaret F Demontez Novack, MD

## 2015-06-20 NOTE — Progress Notes (Signed)
Speech Language Pathology Treatment: Dysphagia  Patient Details Name: Kimberly Acosta MRN: [id number] DOB: Apr 30, 1920 Today's Date: 06/20/2015 Time: 1445-1540 SLP Time Calculation (min) (ACUTE ONLY): 55 min  Assessment / Plan / Recommendation Clinical Impression  Pt presents w/ increased risk for aspiration sec. to her declined Cognitive awareness for the task of taking po's then swallowing and clearing adequately. Pt required mod.-max. cues to attend to task of taking the po's from the utensil then manipulating them in her mouth for A-P transfer for swallowing. Pt intermittently swallowed spontaneously but exhibited a delayed throat clear and cough w/ trials of puree(1/2 tsp) and immediate coughing w/ tsp amount of water(thin liquid). Pt's pharyngeal swallows were not immediate and suspect impact from the oral phase deficits(lengthy manipulation which could become spillage into the pharynx. Due to pt's increased risk for aspiration at this time sec. to her declined Cognitive status, weakness, oropharyngeal phase Dysphagia, and quick drowsiness, an oral diet is NOT rec'd at this time. Instead, pleasure trials of single ice chips and 1/2 tsp amounts of water given w/ strict aspiration precautions and to stop if any increased coughing or discomfort noted. ST will continue to f/u w/ pt's presentation and readiness for further po trials of foods/liquids hopefully to establish an oral diet, safely on Friday. This information was discussed w/ the Son in room during the session and agreed upon. MD/NSG and Hospice were updated. Sign posted re: po ice chips and tsps of water trials.   HPI HPI: Pt 81 year old female with past medical history of dementia, history of CHF, coronary artery disease, hypertension who presented to the hospital due to altered mental status and noted to be severely hypernatremic and dehydrated. Pt is a little more awake at times today w/ mumbled speech; appears to be interacting some w/  family and others. This may be close to her baseline per MD. She continues to be drowsy and easily falls asleep. Pt has been NPO sec. to her declined alertness to safely take po's. Palliative Care and Hospice are following her. Pt has been tolerating trials of ice chips per Son in room; he denied any trouble swallowing but unsure about this. Pt appears more awake today for session w/ few mumbled verbalizations; short phrases in response to Son. She requires constant cues to attend to task of taking po trials then swallowing. She became drowsy after only a few po trials. Pt has been several days w/out nutrition now.       SLP Plan  Continue with current plan of care     Recommendations  Diet recommendations: NPO (w/ ice chips; small tsp of water w/ precautions as tols.) Liquids provided via: Teaspoon Medication Administration: Via alternative means Supervision: Trained caregiver to feed patient Compensations: Minimize environmental distractions;Slow rate;Small sips/bites;Multiple dry swallows after each bite/sip Postural Changes and/or Swallow Maneuvers: Seated upright 90 degrees              General recommendations:  (palliative care; Hospice) Oral Care Recommendations: Oral care QID;Staff/trained caregiver to provide oral care Follow up Recommendations: Skilled Nursing facility Plan: Continue with current plan of care  Jerilynn SomKatherine Karel Mowers, MS, CCC-SLP  Kimberly Acosta 06/20/2015, 5:42 PM

## 2015-06-21 DIAGNOSIS — E870 Hyperosmolality and hypernatremia: Secondary | ICD-10-CM | POA: Insufficient documentation

## 2015-06-21 LAB — BASIC METABOLIC PANEL
Anion gap: 4 — ABNORMAL LOW (ref 5–15)
BUN: 42 mg/dL — ABNORMAL HIGH (ref 6–20)
CALCIUM: 8.7 mg/dL — AB (ref 8.9–10.3)
CO2: 26 mmol/L (ref 22–32)
CREATININE: 1.91 mg/dL — AB (ref 0.44–1.00)
Chloride: 111 mmol/L (ref 101–111)
GFR calc non Af Amer: 22 mL/min — ABNORMAL LOW (ref >60)
GFR, EST AFRICAN AMERICAN: 25 mL/min — AB (ref >60)
Glucose, Bld: 95 mg/dL (ref 65–99)
Potassium: 3.4 mmol/L — ABNORMAL LOW (ref 3.5–5.1)
Sodium: 141 mmol/L (ref 135–145)

## 2015-06-21 NOTE — Progress Notes (Signed)
Palliative Medicine Inpatient Consult Follow Up Note   Name: Kimberly EwingMaggie Testerman Date: 06/21/2015 MRN: [id number]  DOB: 1920-06-28  Referring Physician: Alford Highlandichard Wieting, MD  Palliative Care consult requested for this 81 y.o. female for goals of medical therapy in patient with an admission to treat dehydration and acute renal failure and associated metabolic encephalopathy in this patient who has advanced dementia, dysphagia, and malnutrition [facility].    TODAY'S DISCUSSION AND DECISIONS:  I waited for patient's son to arrive in the evening.  He, his wife, and I went to the Family Room and talked for an hour from 6:30 pm till 7:30 pm.  I discussed the natural course of dementia and drew pictures showing how the brain fails to control motor function needed to move food and liquids to the back of the throat and how, even when this happens, it is just as likely to go down into the lungs as it is likely to go into the stomach.  I also talked somewhat philosophically about life coming to an end ---and once I was made aware that the son and his wife do feel that their Kimberly KnucklesChristian faith is important to them---I talked a bit about the body as a vessel that 'falls off of the spirit' eventually.    The patient's son is VERY FOCUSED on NUMBERS.  He quoted the BUN number and various other labs.  It is apparent that he is not seeing the 'big picture'.  He was cordial and he asked some very appropriate questions.  But, he did not seem to be willing to accept that his mother will start this same process all over again as soon as she is stabilized, rehydrated, and discharged. He asked questions about 'who decides when to discharge her', etc.    I educated the son and his wife about feeding tubes.  His wife lost her brother and her father within a year of each other just a few years ago. Both of them were on feeding tubes for a time.  She nodded in understanding when I drew pictures of tube  feeding refluxing and going over into the airway --as her relatives had had this happen.  I let him know that Geriatric Societies have spoken out against the use of feeding tubes in advanced dementia patients, as it does not solve the problem of aspiration of saliva and it also has its own complications and does not reverse or treat a reversible or manageable problem.  I did let him know that if he wants this done, however, we would do so.   I reviewed code status and he re-iterated 'full code' and seemed to indicate that he wanted me to discuss this further as he has been asked a number of times.    He stated (in other more vague wording) that he is basically waiting for her heart to 'stop' or her kidneys to 'stop' --and says that he 'would be OK with that".   He says he just can't 'give her morphine and make her die'.    He did not indicate a willingness to accept that she will be unable to drink enough to stop the same thing from happening very soon after she is stabilized and discharged. He was told that she will be repeating this same process very soon.He said he learned something and was appreciative of our meeting, but he wasn't going to change anything.  He is also against pt having morphine 'for pain' though I did  educate him about its use to treat a sense of 'smothering'.  He said that his mother tells him she has 'no pain'.    Many of [place] need to continue to educate him.  However, he focuses on numbers and smaller details and remembers all these numbers.  And if we don't bring them up, he will ask.  And then the conversation gets diverted to taking about the 'trees' instead of the 'forest'.  I will follow and see what I can do.  I don't think he was hostile, just not convinced that his mother cannot recover.  His wife seemed to nod and indicated a lot of understanding, but she is not the decision-maker.     REVIEW OF SYSTEMS:  Patient is not able to provide ROS  CODE STATUS: Full code   --reconfirmed with son today   PAST MEDICAL HISTORY: Past Medical History  Diagnosis Date  . CHF (congestive heart failure) (HCC)   . Hypertension   . Renal disorder     Chronic Kidney Disease  . Alzheimer disease   . Dementia   . S/P coronary artery stent placement 2006  . Coronary artery disease     PCI and stent to LAD in 2005    PAST SURGICAL HISTORY:  Past Surgical History  Procedure Laterality Date  . Coronary angioplasty    . Colon surgery      Vital Signs: BP 117/59 mmHg  Pulse 56  Temp(Src) 98 F (36.7 C) (Oral)  Resp 17  Ht 5' (1.524 m)  Wt 63.64 kg (140 lb 4.8 oz)  BMI 27.40 kg/m2  SpO2 100% Filed Weights   06/14/15 1417 06/14/15 2116  Weight: 57.516 kg (126 lb 12.8 oz) 63.64 kg (140 lb 4.8 oz)    Estimated body mass index is 27.4 kg/(m^2) as calculated from the following:   Height as of this encounter: 5' (1.524 m).   Weight as of this encounter: 63.64 kg (140 lb 4.8 oz).  PHYSICAL EXAM: Lying in medical bed in NAD Looks younger than stated age EOMI OP clear Some drooling noted earlier No JVD or TM Hrt rrr no mgr Lungs with occas ronchi Abd soft and NT Ext no cyanosis or mottling.  Confused, mumbles a bit  LABS: CBC:    Component Value Date/Time   WBC 3.1* 06/15/2015 0452   HGB 10.6* 06/15/2015 0452   HCT 34.5* 06/15/2015 0452   PLT 49* 06/15/2015 0452   MCV 87.1 06/15/2015 0452   NEUTROABS 3.1 06/14/2015 1706   LYMPHSABS 0.4* 06/14/2015 1706   MONOABS 0.0* 06/14/2015 1706   EOSABS 0.0 06/14/2015 1706   BASOSABS 0.0 06/14/2015 1706   Comprehensive Metabolic Panel:    Component Value Date/Time   NA 141 06/21/2015 0517   K 3.4* 06/21/2015 0517   CL 111 06/21/2015 0517   CO2 26 06/21/2015 0517   BUN 42* 06/21/2015 0517   CREATININE 1.91* 06/21/2015 0517   GLUCOSE 95 06/21/2015 0517   CALCIUM 8.7* 06/21/2015 0517   AST 59* 06/14/2015 1706   ALT 47 06/14/2015 1706   ALKPHOS 102 06/14/2015 1706   BILITOT 1.8* 06/14/2015 1706    PROT 7.8 06/14/2015 1706   ALBUMIN 3.2* 06/14/2015 1706      Time Spent:  60 minutes

## 2015-06-21 NOTE — Plan of Care (Signed)
Problem: Education: Goal: Knowledge of [place] [facility] Education information/materials will improve Outcome: Progressing Pt confused  Problem: Safety: Goal: Ability to remain free from injury will improve Outcome: Progressing Pt confused

## 2015-06-21 NOTE — Progress Notes (Signed)
Patient ID: Kimberly Acosta, female   DOB: Jul 15, 1920, 81 y.o.   MRN: [id number] [facility] PRO[facility]  [facility]  HPI/Subjective: Patient is alert and answers some questions. Patient is able to follow some commands.  Objective: Filed Vitals:   06/21/15 0436 06/21/15 0912  BP: 142/51 112/51  Pulse: 114 119  Temp: 97.8 F (36.6 C) 97.4 F (36.3 C)  Resp: 18 17    Filed Weights   06/14/15 1417 06/14/15 2116  Weight: 57.516 kg (126 lb 12.8 oz) 63.64 kg (140 lb 4.8 oz)    ROS: Review of Systems  Unable to perform ROS Respiratory: Positive for shortness of breath.   Cardiovascular: Negative for chest pain.  Gastrointestinal: Negative for nausea, abdominal pain and diarrhea.  Neurological: Positive for headaches.   Limited with dementia  Exam: Physical Exam  HENT:  Nose: No mucosal edema.  Mouth/Throat: No oropharyngeal exudate or posterior oropharyngeal edema.  Eyes: Conjunctivae, EOM and lids are normal. Pupils are equal, round, and reactive to light.  Neck: No JVD present. Carotid bruit is not present. No edema present. No thyroid mass and no thyromegaly present.  Cardiovascular: S1 normal and S2 normal.  Exam reveals no gallop.   No murmur heard. Pulses:      Dorsalis pedis pulses are 2+ on the right side, and 2+ on the left side.  Respiratory: No respiratory distress. She has no wheezes. She has no rhonchi. She has no rales.  GI: Soft. Bowel sounds are normal. There is no tenderness.  Musculoskeletal:       Right ankle: She exhibits swelling.       Left ankle: She exhibits swelling.  Lymphadenopathy:    She has no cervical adenopathy.  Neurological: She is alert.  Skin: Skin is warm. No rash noted. Nails show no clubbing.  Psychiatric: She has a normal mood and affect.    Data Reviewed: Basic Metabolic Panel:  Recent Labs Lab 06/17/15 0443 06/18/15 0530 06/19/15 0451 06/20/15 0532 06/21/15 0517  NA 148* 146*  144 144 141  K 3.9 3.8 3.6 3.5 3.4*  CL 119* 116* 115* 113* 111  CO2 22 22 22 22 26   GLUCOSE 102* 96 96 92 95  BUN 69* 67* 63* 51* 42*  CREATININE 2.84* 2.93* 2.61* 2.15* 1.91*  CALCIUM 8.7* 8.7* 8.6* 8.7* 8.7*   Liver Function Tests:  Recent Labs Lab 06/14/15 1706  AST 59*  ALT 47  ALKPHOS 102  BILITOT 1.8*  PROT 7.8  ALBUMIN 3.2*    Recent Labs Lab 06/14/15 1706  LIPASE 45    Recent Labs Lab 06/18/15 1259  AMMONIA 80*   CBC:  Recent Labs Lab 06/14/15 1706 06/15/15 0452  WBC 3.5* 3.1*  NEUTROABS 3.1  --   HGB 10.3* 10.6*  HCT 34.8* 34.5*  MCV 86.8 87.1  PLT 45* 49*   Cardiac Enzymes:  Recent Labs Lab 06/14/15 1706  TROPONINI 0.11*   BNP (last 3 results)  Recent Labs  03/29/15 1547 03/30/15 0416  BNP 1402.0* 1393.0*      Recent Results (from the past 240 hour(s))  Blood culture (single)     Status: None   Collection Time: 06/14/15  4:01 PM  Result Value Ref Range Status   Specimen Description BLOOD LEFT AC  Final   Special Requests BOTTLES DRAWN AEROBIC AND ANAEROBIC  1CC  Final   Culture NO GROWTH 6 DAYS  Final   Report Status 06/20/2015 FINAL  Final  Urine  culture     Status: None   Collection Time: 06/14/15  4:34 PM  Result Value Ref Range Status   Specimen Description URINE, CATHETERIZED  Final   Special Requests NONE  Final   Culture 5,000 COLONIES/mL GRAM POSITIVE RODS  Final   Report Status 06/18/2015 FINAL  Final     Scheduled Meds: . antiseptic oral rinse  7 mL Mouth Rinse q12n4p  . chlorhexidine  15 mL Mouth Rinse BID   Continuous Infusions: . dextrose 50 mL/hr at 06/20/15 2031    Assessment/Plan:  1. Acute renal failure on chronic kidney disease- creatinine has improved to 1.91 with IV fluid hydration.  2. Severe hypernatremia- improved from 167 down to 141 3. Acute encephalopathy- improved from admission 4. Dementia without behavioral disturbance 5. Failure to thrive- the patient's son is still having a  difficult decision on next plan of care. I advised that we need to make a decision on a feeding tube or not. The patient failed her swallow evaluation last night. The patient will not be able to keep up with her nutritional needs. I cannot keep the IV fluids going on forever. We would either have to place a PEG tube for nutritional needs versus hospice and comfort care if they decide against a feeding tube. Overall prognosis is poor. The son is going to meet with [facility] and discuss his mom's case with them and then hopefully come up with a decision. At this point he does not want me to stop the IV fluid hydration.  Code Status:     Code Status Orders        Start     Ordered   06/14/15 1943  Full code   Continuous     06/14/15 1944    Advance Directive Documentation        Most Recent Value   Type of Advance Directive  Healthcare Power of Attorney   Pre-existing out of facility DNR order (yellow form or pink MOST form)     "MOST" Form in Place?       Family Communication: Son at the bedside Disposition Plan: I do not have a safe disposition at this point. A PEG tube versus hospice care needs to be figured out prior to disposition.  Consultants:  Nephrology  Palliative care  Time spent: 30 minutes, greater than 50% time spent in coordination of care.  Alford Highland  [facility]

## 2015-06-21 NOTE — Progress Notes (Signed)
Subjective:  Creatinine currently down to 1.91. Patient remains on IV fluid hydration. Patient's son is currently at bedside. She failed swallow study.   Objective:  Vital signs in last 24 hours:  Temp:  [97.3 F (36.3 C)-98.4 F (36.9 C)] 97.4 F (36.3 C) (11/24 0912) Pulse Rate:  [42-119] 119 (11/24 0912) Resp:  [17-18] 17 (11/24 0912) BP: (100-142)/(50-72) 112/51 mmHg (11/24 0912) SpO2:  [95 %-100 %] 96 % (11/24 0912)  Weight change:  Filed Weights   06/14/15 1417 06/14/15 2116  Weight: 57.516 kg (126 lb 12.8 oz) 63.64 kg (140 lb 4.8 oz)    Intake/Output:    Intake/Output Summary (Last 24 hours) at 06/21/15 0944 Last data filed at 06/21/15 0543  Gross per 24 hour  Intake   1035 ml  Output      0 ml  Net   1035 ml    HEMODIALYSIS FLOW SHEET:      Physical Exam: General:  elderly, frail   HEENT  moist oral mucous membranes   Neck  supple   Pulm/lungs  normal effort, clear anteriorly and laterally   CVS/Heart  irregular rhythm, 2/6 SEM heard  Abdomen:   soft, nontender, nondistended   Extremities:  2+ b/l LE edema.  Neurologic:  awake, alert, follows commands  Skin:  no rashes          Basic Metabolic Panel:   Recent Labs Lab 06/17/15 0443 06/18/15 0530 06/19/15 0451 06/20/15 0532 06/21/15 0517  NA 148* 146* 144 144 141  K 3.9 3.8 3.6 3.5 3.4*  CL 119* 116* 115* 113* 111  CO2 GLUCOSE 102* 96 96 92 95  BUN 69* 67* 63* 51* 42*  CREATININE 2.84* 2.93* 2.61* 2.15* 1.91*  CALCIUM 8.7* 8.7* 8.6* 8.7* 8.7*     CBC:  Recent Labs Lab 06/14/15 1706 06/15/15 0452  WBC 3.5* 3.1*  NEUTROABS 3.1  --   HGB 10.3* 10.6*  HCT 34.8* 34.5*  MCV 86.8 87.1  PLT 45* 49*      Microbiology:  Recent Results (from the past 720 hour(s))  Blood culture (single)     Status: None   Collection Time: 06/14/15  4:01 PM  Result Value Ref Range Status   Specimen Description BLOOD LEFT AC  Final   Special Requests BOTTLES DRAWN AEROBIC  AND ANAEROBIC  1CC  Final   Culture NO GROWTH 6 DAYS  Final   Report Status 06/20/2015 FINAL  Final  Urine culture     Status: None   Collection Time: 06/14/15  4:34 PM  Result Value Ref Range Status   Specimen Description URINE, CATHETERIZED  Final   Special Requests NONE  Final   Culture 5,000 COLONIES/mL GRAM POSITIVE RODS  Final   Report Status 06/18/2015 FINAL  Final    Coagulation Studies: No results for input(s): LABPROT, INR in the last 72 hours.  Urinalysis: No results for input(s): COLORURINE, LABSPEC, PHURINE, GLUCOSEU, HGBUR, BILIRUBINUR, KETONESUR, PROTEINUR, UROBILINOGEN, NITRITE, LEUKOCYTESUR in the last 72 hours.  Invalid input(s): APPERANCEUR    Imaging: No results found.   Medications:   . dextrose 50 mL/hr at 06/20/15 2031   . antiseptic oral rinse  7 mL Mouth Rinse q12n4p  . chlorhexidine  15 mL Mouth Rinse BID   acetaminophen **OR** acetaminophen, glycopyrrolate  Assessment/ Plan:  81 y.o. African-American female female with medical problems of dementia, chronic systolic congestive heart failure with EF 45-50%, moderate mitral regurgitation, dilated right ventricle, reduced right  ventricular systolic function, severe tricuspid regurgitation, hypertension, chronic kidney disease, coronary disease with stent/PCN 2005,, who was admitted from nursing home to [facility] on 06/14/2015 for evaluation of severe hyponatremia, acute renal failure and acute encephalopathy.  1. Acute renal failure on chronic kidney disease stage III Baseline creatinine Is 1.45/GFR of 35 Acute renal failure is likely secondary to ATN from volume depletion. Renal U/S neg for obstruction Patient was getting furosemide, Cozaar, spironolactone, metoprolol as outpatient -Renal function appears to be improving at present. Creatinine down to 1.91. Continue IV fluid hydration for now.  2. Severe hypernatremia. Sodium down to 141. Continue D5W for 1 more day.  3. Altered mental status Cause  is not clear, but likely severe dehydration Possibly related to high ammonia level -It appears her mental status is slowly improving. The patient has underlying dementia. She is awake and alert and following simple commands this a.m.   LOS: 7 Kimberly Acosta 11/24/20169:44 AM

## 2015-06-21 NOTE — Progress Notes (Signed)
Pt awake and able to respond to commands at this time. Ate a few bites of ice chips with assistance.  HOB remains elevated.  Son at bedside.

## 2015-06-21 NOTE — Plan of Care (Signed)
Problem: Education: Goal: Knowledge of [place] [facility] Education information/materials will improve Outcome: Not Progressing Pt is nonverbal     Problem: Health Behavior/Discharge Planning: Goal: Ability to manage health-related needs will improve Pt is nonverbal     Problem: Activity: Goal: Risk for activity intolerance will decrease Outcome: Not Progressing Pt is immobile-turning pt q2h     Problem: Nutrition: Goal: Adequate nutrition will be maintained Outcome: Not Progressing Pt is NPO

## 2015-06-22 DIAGNOSIS — N179 Acute kidney failure, unspecified: Secondary | ICD-10-CM | POA: Insufficient documentation

## 2015-06-22 LAB — BASIC METABOLIC PANEL
Anion gap: 6 (ref 5–15)
BUN: 30 mg/dL — AB (ref 6–20)
CALCIUM: 8.5 mg/dL — AB (ref 8.9–10.3)
CHLORIDE: 111 mmol/L (ref 101–111)
CO2: 22 mmol/L (ref 22–32)
CREATININE: 1.46 mg/dL — AB (ref 0.44–1.00)
GFR, EST AFRICAN AMERICAN: 35 mL/min — AB (ref >60)
GFR, EST NON AFRICAN AMERICAN: 30 mL/min — AB (ref >60)
Glucose, Bld: 102 mg/dL — ABNORMAL HIGH (ref 65–99)
Potassium: 4.2 mmol/L (ref 3.5–5.1)
Sodium: 139 mmol/L (ref 135–145)

## 2015-06-22 MED ORDER — SODIUM CHLORIDE 0.45 % IV SOLN
INTRAVENOUS | Status: DC
  Administered 2015-06-22 – 2015-06-24 (×4): via INTRAVENOUS

## 2015-06-22 NOTE — Progress Notes (Signed)
Patient ID: Kimberly EwingMaggie Acosta, female   DOB: [birth year], 81 y.o.   MRN: [id number] Occasional chest pain [facility] PROGRESS NOTE  [facility]  HPI/Subjective: Patient is alert and answers some questions. Patient is able to follow some commands. Occasional chest pain. Patient seen earlier in the day and again now after sun requesting [facility].  Objective: Filed Vitals:   06/22/15 1002 06/22/15 1256  BP: 128/53 123/87  Pulse: 42 66  Temp: 98.1 F (36.7 C) 98.1 F (36.7 C)  Resp: 17 17    Filed Weights   06/14/15 1417 06/14/15 2116  Weight: 57.516 kg (126 lb 12.8 oz) 63.64 kg (140 lb 4.8 oz)    ROS: Review of Systems  Respiratory: Positive for shortness of breath.   Cardiovascular: Positive for chest pain.  Gastrointestinal: Negative for nausea, abdominal pain and diarrhea.  Neurological: Positive for headaches.   Limited with dementia  Exam: Physical Exam  HENT:  Nose: No mucosal edema.  Mouth/Throat: No oropharyngeal exudate or posterior oropharyngeal edema.  Eyes: Conjunctivae, EOM and lids are normal. Pupils are equal, round, and reactive to light.  Neck: No JVD present. Carotid bruit is not present. No edema present. No thyroid mass and no thyromegaly present.  Cardiovascular: S1 normal and S2 normal.  Exam reveals no gallop.   No murmur heard. Pulses:      Dorsalis pedis pulses are 2+ on the right side, and 2+ on the left side.  Respiratory: No respiratory distress. She has no wheezes. She has no rhonchi. She has no rales.  GI: Soft. Bowel sounds are normal. There is no tenderness.  Musculoskeletal:       Right ankle: She exhibits swelling.       Left ankle: She exhibits swelling.  Lymphadenopathy:    She has no cervical adenopathy.  Neurological: She is alert.  Skin: Skin is warm. No rash noted. Nails show no clubbing.  Psychiatric: She has a normal mood and affect.    Data Reviewed: Basic Metabolic  Panel:  Recent Labs Lab 06/18/15 0530 06/19/15 0451 06/20/15 0532 06/21/15 0517 06/22/15 0808  NA 146* 144 144 141 139  K 3.8 3.6 3.5 3.4* 4.2  CL 116* 115* 113* 111 111  CO2 22 22 22 26 22   GLUCOSE 96 96 92 95 102*  BUN 67* 63* 51* 42* 30*  CREATININE 2.93* 2.61* 2.15* 1.91* 1.46*  CALCIUM 8.7* 8.6* 8.7* 8.7* 8.5*   BNP (last 3 results)  Recent Labs  03/29/15 1547 03/30/15 0416  BNP 1402.0* 1393.0*      Recent Results (from the past 240 hour(s))  Blood culture (single)     Status: None   Collection Time: 06/14/15  4:01 PM  Result Value Ref Range Status   Specimen Description BLOOD LEFT AC  Final   Special Requests BOTTLES DRAWN AEROBIC AND ANAEROBIC  1CC  Final   Culture NO GROWTH 6 DAYS  Final   Report Status 06/20/2015 FINAL  Final  Urine culture     Status: None   Collection Time: 06/14/15  4:34 PM  Result Value Ref Range Status   Specimen Description URINE, CATHETERIZED  Final   Special Requests NONE  Final   Culture 5,000 COLONIES/mL GRAM POSITIVE RODS  Final   Report Status 06/18/2015 FINAL  Final     Scheduled Meds: . antiseptic oral rinse  7 mL Mouth Rinse q12n4p  . chlorhexidine  15 mL Mouth Rinse BID   Continuous Infusions: .  sodium chloride 50 mL/hr at 06/22/15 1108    Assessment/Plan:  1. Acute renal failure on chronic kidney disease- creatinine has improved to 1.46  with IV fluid hydration.  2. Severe hypernatremia- improved from 167 down to 139 3. Acute encephalopathy- improved from admission 4. Dementia without behavioral disturbance 5. Failure to thrive- patient seen by speech therapy today. It took about 15 minutes to get 1 ounce of food in her. I do not think the patient will be able to keep up with nutritional needs. Son requested a transfer to [facility] today. He is still hesitant on a PEG tube feeding. I have the social worker call the facility that the patient lives at and the patient wouldn't lose the facility that she sat if she  got a PEG and she would lose hospice services. Patient is a full code and overall prognosis is poor. I explained to the son I do not have a good disposition at this point since he does want treatment but not wanting a PEG. The patient is high risk for dehydration and readmission.  Code Status:     Code Status Orders        Start     Ordered   06/14/15 1943  Full code   Continuous     06/14/15 1944    Advance Directive Documentation        Most Recent Value   Type of Advance Directive  Healthcare Power of Attorney   Pre-existing out of facility DNR order (yellow form or pink MOST form)     "MOST" Form in Place?       Family Communication: Son at the bedside Disposition Plan: son now requesting transfer to [facility]  Consultants:  Nephrology  Palliative care  Time spent: 30 minutes, greater than 50% time spent in coordination of care.  Alford Highland  [facility]

## 2015-06-22 NOTE — Progress Notes (Signed)
Speech Language Pathology Treatment: Dysphagia  Patient Details Name: Colbert EwingMaggie Cambridge MRN: [id number] DOB: 12-02-1919 Today's Date: 06/22/2015 Time: [id number] SLP Time Calculation (min) (ACUTE ONLY): 45 min  Assessment / Plan / Recommendation Clinical Impression  Pt presents w/moderate aspiration risk d/t cognitive status. Pt demonstrated decreased risk of aspiration w/thicker liquids and puree consistencies. Pt demonstrated intermittent throat clear w/tsps of water and honey thick by cup. Pt also demonstrated immediate cough following one tsp of water.  However, no overt s/s of aspiration were observed w/honey thick by tsp or puree by tsp. Pt continues to demonstrate intermittent prolonged oral transit and requires cues to attend to PO intake. Pt is more alert today and appears to require less cueing for safe PO intake. Pt continues to require increase time to take small amounts of PO (~15 minutes for one ounce of puree); however per son pt required 60+ minutes at baseline to complete a meal. Pt will require continued monitoring for adequate PO intake as well as aspiration risk. Discussed w/MD, nsg, and RD and son. Will continue to follow closely.     HPI HPI: Pt 81 year old female with past medical history of dementia, history of CHF, coronary artery disease, hypertension who presented to the hospital due to altered mental status and noted to be severely hypernatremic and dehydrated. Pt is a little more awake today w/ mumbled speech; appears to be interacting more w/ family and others. Pt has been NPO sec. to her declined alertness to safely take po's. Palliative Care and Hospice are following her. Pt has been tolerating trials of ice chips per Son in room; he denied any trouble swallowing but unsure about this. Pt appears more awake today for session w/mumbled verbalizations. Pt is difficult for ST to understand, however son says he understands what she is saying. She requires intermittent cues to  attend to task of taking po trials then swallowing. Pt did not become drowsy as quickly today. Pt has been several days w/out nutrition now.  Per chart review pt's son has not been able to make a decision regarding PEG placement.       SLP Plan  Continue with current plan of care     Recommendations  Diet recommendations: Dysphagia 1 (puree);Honey-thick liquid Liquids provided via: Teaspoon Medication Administration: Crushed with puree Supervision: Trained caregiver to feed patient;Full supervision/cueing for compensatory strategies Compensations: Minimize environmental distractions;Slow rate;Small sips/bites;Multiple dry swallows after each bite/sip Postural Changes and/or Swallow Maneuvers: Seated upright 90 degrees              Oral Care Recommendations: Oral care QID;Staff/trained caregiver to provide oral care Follow up Recommendations: Skilled Nursing facility Plan: Continue with current plan of care   Rhodes,Maaran 06/22/2015, 2:51 PM

## 2015-06-22 NOTE — Plan of Care (Signed)
Problem: Education: Goal: Knowledge of [place] [facility] Education information/materials will improve Outcome: Not Progressing Pt is confused     Problem: Health Behavior/Discharge Planning: Goal: Ability to manage health-related needs will improve Outcome: Not Progressing Pt is confused     Problem: Nutrition: Goal: Adequate nutrition will be maintained Pt is npo

## 2015-06-22 NOTE — Progress Notes (Signed)
Nutrition Follow-up    INTERVENTION:   Medical Food Supplement Therapy: add Magic Cup and Honey Thick Mighty Shakes TID on all meal trays Coordination of Care: await further decision regarding PEG tube placement  NUTRITION DIAGNOSIS:   Inadequate oral intake related to acute illness as evidenced by NPO status.  GOAL:   Patient will meet greater than or equal to 90% of their needs  MONITOR:    (Energy intake, Electrolyte and renal profile, Anthropometric)  REASON FOR ASSESSMENT:   Malnutrition Screening Tool    ASSESSMENT:    Noted MD discussing possible PEG placement with son; no decisions made, did note [facility] but pt not accepted for transfer   Diet Order:  DIET - DYS 1 Room service appropriate?: Yes; Fluid consistency:: Honey Thick   Energy Intake: diet advanced per SLP today, took pt 15 minutes to take 1 ounce of food with SLP. SLP also reports that son indicates that it typically takes over an hour for pt to eat a meal prior to admission  Electrolyte and Renal Profile:  Recent Labs Lab 06/20/15 0532 06/21/15 0517 06/22/15 0808  BUN 51* 42* 30*  CREATININE 2.15* 1.91* 1.46*  NA 144 141 139  K 3.5 3.4* 4.2   Glucose Profile: No results for input(s): GLUCAP in the last 72 hours.  Meds: 1/2 NS at 50 ml/hr  Height:   Ht Readings from Last 1 Encounters:  06/14/15 5' (1.524 m)    Weight: no new weight  Wt Readings from Last 1 Encounters:  06/14/15 140 lb 4.8 oz (63.64 kg)    Filed Weights   06/14/15 1417 06/14/15 2116  Weight: 126 lb 12.8 oz (57.516 kg) 140 lb 4.8 oz (63.64 kg)    BMI:  Body mass index is 27.4 kg/(m^2).  Estimated Nutritional Needs:   Kcal:  Using IBW of 45kg BEE 776 kcals (IF 1.1-1.3, AF 1.2) [id number] kcals/d  Protein:  (1.2-1.5 g/kg) 54-68 gm/d  Fluid:  (25-4330ml/kg) 1125-134650ml/d  EDUCATION NEEDS:   No education needs identified at this time  HIGH Care Level  Romelle Starcherate Durk Carmen MS, RD, LDN [phone]  [phone] Pager

## 2015-06-22 NOTE — Progress Notes (Signed)
Subjective:  Renal function continues to improve under hydration. Creatinine down to 1.46. Potassium also up to 4.2 today.    Objective:  Vital signs in last 24 hours:  Temp:  [97.7 F (36.5 C)-98 F (36.7 C)] 97.8 F (36.6 C) (11/25 0556) Pulse Rate:  [35-61] 61 (11/25 0556) Resp:  [16-17] 16 (11/25 0556) BP: (105-118)/(45-97) 118/97 mmHg (11/25 0556) SpO2:  [96 %-100 %] 100 % (11/25 0556)  Weight change:  Filed Weights   06/14/15 1417 06/14/15 2116  Weight: 57.516 kg (126 lb 12.8 oz) 63.64 kg (140 lb 4.8 oz)    Intake/Output:    Intake/Output Summary (Last 24 hours) at 06/22/15 0934 Last data filed at 06/21/15 2120  Gross per 24 hour  Intake  817.3 ml  Output      0 ml  Net  817.3 ml    HEMODIALYSIS FLOW SHEET:      Physical Exam: General:  elderly, frail   HEENT  moist oral mucous membranes   Neck  supple   Pulm/lungs  normal effort, clear anteriorly and laterally   CVS/Heart  irregular rhythm, 2/6 SEM   Abdomen:   soft, nontender, nondistended   Extremities:  trace b/l LE edema.  Neurologic:  awake, alert, follows commands  Skin:  no rashes          Basic Metabolic Panel:   Recent Labs Lab 06/18/15 0530 06/19/15 0451 06/20/15 0532 06/21/15 0517 06/22/15 0808  NA 146* 144 144 141 139  K 3.8 3.6 3.5 3.4* 4.2  CL 116* 115* 113* 111 111  CO2 GLUCOSE 96 96 92 95 102*  BUN 67* 63* 51* 42* 30*  CREATININE 2.93* 2.61* 2.15* 1.91* 1.46*  CALCIUM 8.7* 8.6* 8.7* 8.7* 8.5*     CBC: No results for input(s): WBC, NEUTROABS, HGB, HCT, MCV, PLT in the last 168 hours.    Microbiology:  Recent Results (from the past 720 hour(s))  Blood culture (single)     Status: None   Collection Time: 06/14/15  4:01 PM  Result Value Ref Range Status   Specimen Description BLOOD LEFT AC  Final   Special Requests BOTTLES DRAWN AEROBIC AND ANAEROBIC  1CC  Final   Culture NO GROWTH 6 DAYS  Final   Report Status 06/20/2015 FINAL  Final  Urine  culture     Status: None   Collection Time: 06/14/15  4:34 PM  Result Value Ref Range Status   Specimen Description URINE, CATHETERIZED  Final   Special Requests NONE  Final   Culture 5,000 COLONIES/mL GRAM POSITIVE RODS  Final   Report Status 06/18/2015 FINAL  Final    Coagulation Studies: No results for input(s): LABPROT, INR in the last 72 hours.  Urinalysis: No results for input(s): COLORURINE, LABSPEC, PHURINE, GLUCOSEU, HGBUR, BILIRUBINUR, KETONESUR, PROTEINUR, UROBILINOGEN, NITRITE, LEUKOCYTESUR in the last 72 hours.  Invalid input(s): APPERANCEUR    Imaging: No results found.   Medications:   . dextrose 50 mL/hr at 06/21/15 1613   . antiseptic oral rinse  7 mL Mouth Rinse q12n4p  . chlorhexidine  15 mL Mouth Rinse BID   acetaminophen **OR** acetaminophen, glycopyrrolate  Assessment/ Plan:  81 y.o. African-American female female with medical problems of dementia, chronic systolic congestive heart failure with EF 45-50%, moderate mitral regurgitation, dilated right ventricle, reduced right ventricular systolic function, severe tricuspid regurgitation, hypertension, chronic kidney disease, coronary disease with stent/PCN 2005,, who was admitted from nursing home to [facility] on 06/14/2015 for  evaluation of severe hyponatremia, acute renal failure and acute encephalopathy.  1. Acute renal failure on chronic kidney disease stage III Baseline creatinine Is 1.45/GFR of 35 Acute renal failure is likely secondary to ATN from volume depletion. Renal U/S neg for obstruction Patient was getting furosemide, Cozaar, spironolactone, metoprolol as outpatient -Creatinine now improved down to 1.46. We will continue hydration for now. Patient has aspiration and will be unlikely to take in by mouth fluids. Patient's son unclear on PEG tube placement  2. Severe hypernatremia. Sodium down to 139. Switch fluids to half-normal saline.  3. Altered mental status Cause is not clear, but likely  severe dehydration Possibly related to high ammonia level -Mental status has improved per the patient's son and per our evaluation. The patient is arousable and will follow commands.   LOS: 8 Asheley Hellberg 11/25/20169:34 AM

## 2015-06-22 NOTE — Progress Notes (Signed)
Palliative Care Update   I have discussed pt with Care Mgr and with pt's attending, Dr Hilton SinclairWeiting.  I saw pt's son in elevator today (no one else was on the elevator) and I attempted to have some pleasant conversation. It was apparent from his reaction to my conversation about Kimberly Acosta -- that he does not wish to talk with me any further.    Dr Hilton SinclairWeiting was made aware of the details of the conversations I have had with the pt's son. He tells me that the son now wants pt [facility] to [facility].   I am signing off at this time.  Dr. Hilton SinclairWeiting agrees that is OK, since I have tried my best to converse with son about 'expectations' and the natural course of end-stage dementia with anorexia and dying while not eating or drinking enough to stay alive.  But, son is not inclined to want to accept what has been communicated.  Pt has improved enough to follow a few commands.  However, despite her being more alert, I see no oral intake documented recently for pt (zero is entered).    Kimberly HalterMargaret F Maty Zeisler, MD

## 2015-06-22 NOTE — Progress Notes (Signed)
Patient ID: Colbert EwingMaggie Savant, female   DOB: [birth year], 81 y.o.   MRN: [id number]  Spoke with hospitalists at [facility] Dr. Nyra Capesan Kaplan. He did not accept patient as a transfer. He would not recommend a PEG placement. Recommended a hospice care.  Dictated by Dr. Alford Highlandichard Collen Vincent

## 2015-06-22 NOTE — Clinical Social Work Note (Signed)
MD informed CSW that he spoke with patient's son and that the son stated that there was a meeting that was to take place today at 72 but that he did not think this would take place today. MD stated that patient's son needs to decide between PEG or comfort care. Patient's son is not willing to decide between the two at this time allegedly.MD has stated that the son inquired if he chose a PEG, would [place] be able to take her back.    CSW contacted Hazel at [facility] and she stated that there was no meeting today with patient's son and that they would need to have a care team meeting with patient's son before they would agree to take patient back at discharge. Further, Hazel informed CSW that if patient's son chose a PEG, that they would definitely not be able to take patient back. CSW met with patient's son in patient's room this morning and he was very angry in his body language and facial expressions. He did not smile when CSW entered and crossed his arms and spoke in a blunt tone. CSW came to inform him that [facility] would not be able to take patient back if he chose a PEG. CSW also informed patient's son that it was possible that hospice would decline to continue to follow patient as well if a PEG was chosen due to it possibly being considered aggressive care. Patient's son verbalized understanding and stated that he had a lot to think about.  Shela Leff MSW,LCSW [phone]

## 2015-06-22 NOTE — Progress Notes (Signed)
Palliative Medicine Inpatient Consult Follow Up Note   Name: Kimberly Acosta Date: 06/22/2015 MRN: [id number]  DOB: 05-Apr-1920  Referring Physician: Alford Highland, MD  Palliative Care consult requested for this 81 y.o. female for goals of medical therapy in patient with end stage dementia with associated decrease in oral intake and consequential ATN related acute renal failure.  Pt's son has wanted aggressive intervention and artificial hydration despite her being under Hospice care at the facility.    Today's Discussions:  I spoke with the patient's son in the patient's room briefly.  He was sitting beside her bed with headphones on, and apparently, I was interrupting what he was listening to, so did not plan on a long talk.  I had spoken with him and his wife for an hour two days ago.  When I merely brought up the issue of hydration and eating/ feeding tube, he became very irriated.  He said as he gestured broadly, "NOT NOW!!".  He then said, "I told those other doctors that I am going to be talking to some people who know some things and then I will deal with this.".  I apologized for not knowing this was his plan and wished him a good evening and left the room quietly.    Though he was cordial during our first visit, he was not so this evening.  My ability to get him to rethink his approach/ demands is going to be limited going forward since he made it obvious tonight that he will tell others what he wants to do when he wants to discuss it and not when we want to discuss it.  Will update attending and social worker and Hospice staff.      REVIEW OF SYSTEMS:  Patient is not able to provide ROS due to advanced dementia  CODE STATUS: Full Code ( by choice of son --addressed in discussions with son)   PAST MEDICAL HISTORY: Past Medical History  Diagnosis Date  . CHF (congestive heart failure) (HCC)   . Hypertension   . Renal disorder     Chronic Kidney Disease  . Alzheimer disease    . Dementia   . S/P coronary artery stent placement 2006  . Coronary artery disease     PCI and stent to LAD in 2005    PAST SURGICAL HISTORY:  Past Surgical History  Procedure Laterality Date  . Coronary angioplasty    . Colon surgery      Vital Signs: BP 118/97 mmHg  Pulse 61  Temp(Src) 97.8 F (36.6 C) (Oral)  Resp 16  Ht 5' (1.524 m)  Wt 63.64 kg (140 lb 4.8 oz)  BMI 27.40 kg/m2  SpO2 100% Filed Weights   06/14/15 1417 06/14/15 2116  Weight: 57.516 kg (126 lb 12.8 oz) 63.64 kg (140 lb 4.8 oz)    Estimated body mass index is 27.4 kg/(m^2) as calculated from the following:   Height as of this encounter: 5' (1.524 m).   Weight as of this encounter: 63.64 kg (140 lb 4.8 oz).  PHYSICAL EXAM: NAD Sleeping --I did not waken her Respirations appear even w/o use of acces muscles No facial assymetry or drooling noted  LABS: CBC:    Component Value Date/Time   WBC 3.1* 06/15/2015 0452   HGB 10.6* 06/15/2015 0452   HCT 34.5* 06/15/2015 0452   PLT 49* 06/15/2015 0452   MCV 87.1 06/15/2015 0452   NEUTROABS 3.1 06/14/2015 1706   LYMPHSABS 0.4* 06/14/2015 1706   MONOABS 0.0*  06/14/2015 1706   EOSABS 0.0 06/14/2015 1706   BASOSABS 0.0 06/14/2015 1706   Comprehensive Metabolic Panel:    Component Value Date/Time   NA 139 06/22/2015 0808   K 4.2 06/22/2015 0808   CL 111 06/22/2015 0808   CO2 22 06/22/2015 0808   BUN 30* 06/22/2015 0808   CREATININE 1.46* 06/22/2015 0808   GLUCOSE 102* 06/22/2015 0808   CALCIUM 8.5* 06/22/2015 0808   AST 59* 06/14/2015 1706   ALT 47 06/14/2015 1706   ALKPHOS 102 06/14/2015 1706   BILITOT 1.8* 06/14/2015 1706   PROT 7.8 06/14/2015 1706   ALBUMIN 3.2* 06/14/2015 1706    Time Spent: 15 min

## 2015-06-22 NOTE — Progress Notes (Signed)
06/22/2015  13:20  Pt's son asked me to call the MD and request a transfer [facility].  He said he wanted a second opinion.  Spoke with Dr. Renae GlossWieting about possibility of a transfer and he said such a transfer based on family request was unlikely but that he would come up to the floor and speak with pt's son when he was able to.  I relayed this information to the son.  He was obviously displeased but expressed understanding.  Bradly Chrisougherty, Uri Turnbough E, RN

## 2015-06-23 LAB — BASIC METABOLIC PANEL
Anion gap: 6 (ref 5–15)
BUN: 29 mg/dL — AB (ref 6–20)
CHLORIDE: 110 mmol/L (ref 101–111)
CO2: 24 mmol/L (ref 22–32)
CREATININE: 1.6 mg/dL — AB (ref 0.44–1.00)
Calcium: 8.3 mg/dL — ABNORMAL LOW (ref 8.9–10.3)
GFR calc Af Amer: 31 mL/min — ABNORMAL LOW (ref >60)
GFR calc non Af Amer: 27 mL/min — ABNORMAL LOW (ref >60)
GLUCOSE: 72 mg/dL (ref 65–99)
POTASSIUM: 3.5 mmol/L (ref 3.5–5.1)
Sodium: 140 mmol/L (ref 135–145)

## 2015-06-23 NOTE — Progress Notes (Signed)
Subjective:  Cr currently 1.60. Slightly higher than baseline. Discussed with SLP today. Can have ice chips.  Objective:  Vital signs in last 24 hours:  Temp:  [97.4 F (36.3 C)-98.1 F (36.7 C)] 97.8 F (36.6 C) (11/26 0847) Pulse Rate:  [29-134] 113 (11/26 0847) Resp:  [16-18] 16 (11/26 0525) BP: (115-124)/(56-87) 118/59 mmHg (11/26 0847) SpO2:  [97 %-100 %] 100 % (11/26 0847) Weight:  [65.681 kg (144 lb 12.8 oz)-67.722 kg (149 lb 4.8 oz)] 65.681 kg (144 lb 12.8 oz) (11/26 1041)  Weight change:  Filed Weights   06/14/15 2116 06/23/15 0548 06/23/15 1041  Weight: 63.64 kg (140 lb 4.8 oz) 67.722 kg (149 lb 4.8 oz) 65.681 kg (144 lb 12.8 oz)    Intake/Output:    Intake/Output Summary (Last 24 hours) at 06/23/15 1143 Last data filed at 06/23/15 0900  Gross per 24 hour  Intake 897.34 ml  Output      0 ml  Net 897.34 ml       Physical Exam: General:  elderly, frail   HEENT  moist oral mucous membranes   Neck  supple   Pulm/lungs  normal effort, clear anteriorly and laterally   CVS/Heart  irregular rhythm, 2/6 SEM   Abdomen:   soft, nontender, nondistended   Extremities:  trace b/l LE edema.  Neurologic:  awake, alert, follows commands  Skin:  no rashes          Basic Metabolic Panel:   Recent Labs Lab 06/19/15 0451 06/20/15 0532 06/21/15 0517 06/22/15 0808 06/23/15 0453  NA 144 144 141 139 140  K 3.6 3.5 3.4* 4.2 3.5  CL 115* 113* 111 111 110  CO2 GLUCOSE 96 92 95 102* 72  BUN 63* 51* 42* 30* 29*  CREATININE 2.61* 2.15* 1.91* 1.46* 1.60*  CALCIUM 8.6* 8.7* 8.7* 8.5* 8.3*     CBC: No results for input(s): WBC, NEUTROABS, HGB, HCT, MCV, PLT in the last 168 hours.    Microbiology:  Recent Results (from the past 720 hour(s))  Blood culture (single)     Status: None   Collection Time: 06/14/15  4:01 PM  Result Value Ref Range Status   Specimen Description BLOOD LEFT AC  Final   Special Requests BOTTLES DRAWN AEROBIC AND  ANAEROBIC  1CC  Final   Culture NO GROWTH 6 DAYS  Final   Report Status 06/20/2015 FINAL  Final  Urine culture     Status: None   Collection Time: 06/14/15  4:34 PM  Result Value Ref Range Status   Specimen Description URINE, CATHETERIZED  Final   Special Requests NONE  Final   Culture 5,000 COLONIES/mL GRAM POSITIVE RODS  Final   Report Status 06/18/2015 FINAL  Final    Coagulation Studies: No results for input(s): LABPROT, INR in the last 72 hours.  Urinalysis: No results for input(s): COLORURINE, LABSPEC, PHURINE, GLUCOSEU, HGBUR, BILIRUBINUR, KETONESUR, PROTEINUR, UROBILINOGEN, NITRITE, LEUKOCYTESUR in the last 72 hours.  Invalid input(s): APPERANCEUR    Imaging: No results found.   Medications:   . sodium chloride 50 mL/hr at 06/23/15 0509   . antiseptic oral rinse  7 mL Mouth Rinse q12n4p  . chlorhexidine  15 mL Mouth Rinse BID   acetaminophen **OR** acetaminophen, glycopyrrolate  Assessment/ Plan:  81 y.o. African-American female female with medical problems of dementia, chronic systolic congestive heart failure with EF 45-50%, moderate mitral regurgitation, dilated right ventricle, reduced right ventricular systolic function, severe tricuspid regurgitation, hypertension,  chronic kidney disease, coronary disease with stent/PCN 2005,, who was [facility] from nursing home [facility] 06/14/2015 for evaluation of severe hyponatremia, acute renal failure and acute encephalopathy.  1. Acute renal failure on chronic kidney disease stage III Baseline creatinine Is 1.45/GFR of 35 Acute renal failure is likely secondary to ATN from volume depletion. Renal U/S neg for obstruction Patient was getting furosemide, Cozaar, spironolactone, metoprolol as outpatient -Cr slightly higher today at 1.6, but BUN lower at 29.  Continue hydration while she remains on inpt.  This problem likely to reoccur.  2. Severe hypernatremia. Continue 1/2 NS.   3. Altered mental status Cause is not  clear, but likely severe dehydration Possibly related to high ammonia level -Seems to be at her baseline mental status now.   LOS: 9 Ramaj Frangos 11/26/201611:43 AM

## 2015-06-23 NOTE — Progress Notes (Signed)
Speech Language Pathology Dysphagia Treatment Patient Details Name: Kimberly EwingMaggie Acosta MRN: [id number] DOB: October 05, 1919 Today's Date: 06/23/2015 Time: [id number] SLP Time Calculation (min) (ACUTE ONLY): 22 min  Assessment / Plan / Recommendation Clinical Impression   pt continues to present with a moderate oral pharyngeal dysphagia characterized by pt coughing with trials of nectar thick liquids and thin liquids. Pt was given puree and honey thick liquids with no overt ssx aspiration noted. Pt was given ice chips from son prior to therapy session with him stating no problems. With st trials of oce chips pt was functional with 1 at a time for 2-3 trials for pleasure. ST educated son on status and diet recommendations as well as Dr. Cherylann RatelLateef.     Diet Recommendation    NDDS1 with honey   SLP Plan Continue with current plan of care   Pertinent Vitals/Pain No pain reported   Swallowing Goals     General Behavior/Cognition: Alert;Distractible;Requires cueing;Pleasant mood Patient Positioning: Upright in bed Oral care provided: N/A HPI: Pt 81 year old female with past medical history of dementia, history of CHF, coronary artery disease, hypertension who presented to the hospital due to altered mental status and noted to be severely hypernatremic and dehydrated. Pt is a little more awake today w/ mumbled speech; appears to be interacting more w/ family and others. Pt has been NPO sec. to her declined alertness to safely take po's. Palliative Care and Hospice are following her. Pt has been tolerating trials of ice chips per Son in room; he denied any trouble swallowing but unsure about this. Pt appears more awake today for session w/mumbled verbalizations. Pt is difficult for ST to understand, however son says he understands what she is saying. She requires intermittent cues to attend to task of taking po trials then swallowing. Pt did not become drowsy as quickly today. Pt has been several days w/out  nutrition now.  Per chart review pt's son has not been able to make a decision regarding PEG placement.   Oral Cavity - Oral Hygiene     Dysphagia Treatment Family/Caregiver Educated: Son Treatment Methods: Skilled observation;Patient/caregiver education Patient observed directly with PO's: Yes Type of PO's observed: Dysphagia 1 (puree);Thin liquids;Honey-thick liquids;Ice chips Feeding: Total assist Liquids provided via: Teaspoon;Cup;No straw Oral Phase Signs & Symptoms: Prolonged bolus formation Pharyngeal Phase Signs & Symptoms: Suspected delayed swallow initiation;Immediate throat clear;Immediate cough Type of cueing: Verbal;Tactile;Visual Amount of cueing: Moderate   GO     Meredith PelStacie Harris Sauber 06/23/2015, 12:13 PM

## 2015-06-23 NOTE — Progress Notes (Signed)
Patient ID: Colbert EwingMaggie Kadel, female   DOB: 17-Aug-1919, 81 y.o.   MRN: [id number] Occasional chest pain [facility] PROGRESS NOTE  [facility]  HPI/Subjective: Patient's son would like me to keep the patient in the hospital with IV fluids going for the next few days until she starts eating more. The patient does answer some yes or no questions.  Objective: Filed Vitals:   06/23/15 0847 06/23/15 1219  BP: 118/59 93/65  Pulse: 113 77  Temp: 97.8 F (36.6 C) 98.3 F (36.8 C)  Resp:      Filed Weights   06/14/15 2116 06/23/15 0548 06/23/15 1041  Weight: 63.64 kg (140 lb 4.8 oz) 67.722 kg (149 lb 4.8 oz) 65.681 kg (144 lb 12.8 oz)    ROS: Review of Systems  Respiratory: Positive for shortness of breath.   Cardiovascular: Positive for chest pain.  Gastrointestinal: Negative for nausea, abdominal pain and diarrhea.  Neurological: Positive for headaches.   Limited with dementia  Exam: Physical Exam  HENT:  Nose: No mucosal edema.  Mouth/Throat: No oropharyngeal exudate or posterior oropharyngeal edema.  Eyes: Conjunctivae, EOM and lids are normal. Pupils are equal, round, and reactive to light.  Neck: No JVD present. Carotid bruit is not present. No edema present. No thyroid mass and no thyromegaly present.  Cardiovascular: S1 normal and S2 normal.  Exam reveals no gallop.   No murmur heard. Pulses:      Dorsalis pedis pulses are 2+ on the right side, and 2+ on the left side.  Respiratory: No respiratory distress. She has no wheezes. She has no rhonchi. She has no rales.  GI: Soft. Bowel sounds are normal. There is no tenderness.  Musculoskeletal:       Right ankle: She exhibits swelling.       Left ankle: She exhibits swelling.  Lymphadenopathy:    She has no cervical adenopathy.  Neurological: She is alert.  Skin: Skin is warm. No rash noted. Nails show no clubbing.  Psychiatric: She has a normal mood and affect.    Data  Reviewed: Basic Metabolic Panel:  Recent Labs Lab 06/19/15 0451 06/20/15 0532 06/21/15 0517 06/22/15 0808 06/23/15 0453  NA 144 144 141 139 140  K 3.6 3.5 3.4* 4.2 3.5  CL 115* 113* 111 111 110  CO2 22 22 26 22 24   GLUCOSE 96 92 95 102* 72  BUN 63* 51* 42* 30* 29*  CREATININE 2.61* 2.15* 1.91* 1.46* 1.60*  CALCIUM 8.6* 8.7* 8.7* 8.5* 8.3*   BNP (last 3 results)  Recent Labs  03/29/15 1547 03/30/15 0416  BNP 1402.0* 1393.0*      Recent Results (from the past 240 hour(s))  Blood culture (single)     Status: None   Collection Time: 06/14/15  4:01 PM  Result Value Ref Range Status   Specimen Description BLOOD LEFT AC  Final   Special Requests BOTTLES DRAWN AEROBIC AND ANAEROBIC  1CC  Final   Culture NO GROWTH 6 DAYS  Final   Report Status 06/20/2015 FINAL  Final  Urine culture     Status: None   Collection Time: 06/14/15  4:34 PM  Result Value Ref Range Status   Specimen Description URINE, CATHETERIZED  Final   Special Requests NONE  Final   Culture 5,000 COLONIES/mL GRAM POSITIVE RODS  Final   Report Status 06/18/2015 FINAL  Final     Scheduled Meds: . antiseptic oral rinse  7 mL Mouth Rinse q12n4p  . chlorhexidine  15 mL Mouth Rinse BID   Continuous Infusions: . sodium chloride 50 mL/hr at 06/23/15 0509    Assessment/Plan:  1. Acute renal failure on chronic kidney disease- creatinine is 1.6 today 2. Severe hypernatremia- improved from 167 down to 140  3. Acute encephalopathy- improved from admission 4. Dementia without behavioral disturbance 5. Failure to thrive- son does not want a PEG tube. He would like to keep the IV fluids going until she eats better. I told him on Monday we'll send the patient back to her facility. He states he will probably bring her back to the hospital for further IV fluids if she ends up with altered mental status and dehydrated again. Patient may not qualify for hospice if he keeps wanting treatment with IV fluids.  Code Status:      Code Status Orders        Start     Ordered   06/14/15 1943  Full code   Continuous     06/14/15 1944    Advance Directive Documentation        Most Recent Value   Type of Advance Directive  Healthcare Power of Attorney   Pre-existing out of facility DNR order (yellow form or pink MOST form)     "MOST" Form in Place?       Family Communication: Son at the bedside Disposition Plan: back to her facility on Monday   Consultants:  Nephrology  Palliative care  Time spent: 20 minutes, greater than 50% time spent in coordination of care.  Alford Highland  [facility]

## 2015-06-24 NOTE — Progress Notes (Signed)
Patient ID: Kimberly Acosta, female   DOB: [birth year], 81 y.o.   MRN: [id number] Patient ID: Kimberly Acosta, female   DOB: [birth year], 81 y.o.   MRN: [id number] Occasional chest pain [facility] PROGRESS NOTE  PCP: [facility]  HPI/Subjective: Patient was seen at lunchtime. She was eating but very slowly. She states that she's not hungry. Offers no other complaints  Objective: Filed Vitals:   06/24/15 0917 06/24/15 1324  BP: 116/61 132/75  Pulse: 57 66  Temp: 97.6 F (36.4 C) 97.5 F (36.4 C)  Resp: 17 17    Filed Weights   06/14/15 2116 06/23/15 0548 06/23/15 1041  Weight: 63.64 kg (140 lb 4.8 oz) 67.722 kg (149 lb 4.8 oz) 65.681 kg (144 lb 12.8 oz)    ROS: Review of Systems  Respiratory: Negative for shortness of breath.   Cardiovascular: Negative for chest pain.  Gastrointestinal: Negative for nausea, abdominal pain and diarrhea.  Neurological: Negative for headaches.   Limited with dementia  Exam: Physical Exam  HENT:  Nose: No mucosal edema.  Mouth/Throat: No oropharyngeal exudate or posterior oropharyngeal edema.  Eyes: Conjunctivae, EOM and lids are normal. Pupils are equal, round, and reactive to light.  Neck: No JVD present. Carotid bruit is not present. No edema present. No thyroid mass and no thyromegaly present.  Cardiovascular: S1 normal and S2 normal.  Exam reveals no gallop.   No murmur heard. Pulses:      Dorsalis pedis pulses are 2+ on the right side, and 2+ on the left side.  Respiratory: No respiratory distress. She has no wheezes. She has no rhonchi. She has no rales.  GI: Soft. Bowel sounds are normal. There is no tenderness.  Musculoskeletal:       Right ankle: She exhibits swelling.       Left ankle: She exhibits swelling.  Lymphadenopathy:    She has no cervical adenopathy.  Neurological: She is alert.  Skin: Skin is warm. No rash noted. Nails show no clubbing.  Psychiatric: She has a normal mood and  affect.    Data Reviewed: Basic Metabolic Panel:  Recent Labs Lab 06/19/15 0451 06/20/15 0532 06/21/15 0517 06/22/15 0808 06/23/15 0453  NA 144 144 141 139 140  K 3.6 3.5 3.4* 4.2 3.5  CL 115* 113* 111 111 110  CO2 22 22 26 22 24   GLUCOSE 96 92 95 102* 72  BUN 63* 51* 42* 30* 29*  CREATININE 2.61* 2.15* 1.91* 1.46* 1.60*  CALCIUM 8.6* 8.7* 8.7* 8.5* 8.3*   BNP (last 3 results)  Recent Labs  03/29/15 1547 03/30/15 0416  BNP 1402.0* 1393.0*      Recent Results (from the past 240 hour(s))  Blood culture (single)     Status: None   Collection Time: 06/14/15  4:01 PM  Result Value Ref Range Status   Specimen Description BLOOD LEFT AC  Final   Special Requests BOTTLES DRAWN AEROBIC AND ANAEROBIC  1CC  Final   Culture NO GROWTH 6 DAYS  Final   Report Status 06/20/2015 FINAL  Final  Urine culture     Status: None   Collection Time: 06/14/15  4:34 PM  Result Value Ref Range Status   Specimen Description URINE, CATHETERIZED  Final   Special Requests NONE  Final   Culture 5,000 COLONIES/mL GRAM POSITIVE RODS  Final   Report Status 06/18/2015 FINAL  Final     Scheduled Meds: . antiseptic oral rinse  7 mL Mouth Rinse q12n4p  .  chlorhexidine  15 mL Mouth Rinse BID   Continuous Infusions: . sodium chloride 50 mL/hr at 06/23/15 2050    Assessment/Plan:  1. Acute renal failure on chronic kidney disease- creatinine improved with hydration 2. Severe hypernatremia- improved from 167 down to 140  3. Acute encephalopathy- improved from admission 4. Dementia without behavioral disturbance 5. Failure to thrive- son does not want a PEG tube. He would like to keep the IV fluids going until she eats better. Anticipate discharge back to facility tomorrow.   Code Status:     Code Status Orders        Start     Ordered   06/14/15 1943  Full code   Continuous     06/14/15 1944    Advance Directive Documentation        Most Recent Value   Type of Advance Directive   Healthcare Power of Attorney   Pre-existing out of facility DNR order (yellow form or pink MOST form)     "MOST" Form in Place?       Family Communication: staff from facility at bedside  Disposition Plan: back to her facility on Monday   Consultants:  Nephrology  Palliative care  Time spent: 20 minutes.  Alford Highland  [facility]

## 2015-06-24 NOTE — Progress Notes (Signed)
Subjective:  Patient resting comfortably at bedside. No new renal function testing today.   Objective:  Vital signs in last 24 hours:  Temp:  [97.6 F (36.4 C)-98.3 F (36.8 C)] 97.6 F (36.4 C) (11/27 0917) Pulse Rate:  [56-77] 57 (11/27 0917) Resp:  [17-20] 17 (11/27 0917) BP: (93-116)/(56-71) 116/61 mmHg (11/27 0917) SpO2:  [96 %-100 %] 99 % (11/27 0917)  Weight change: -2.041 kg (-4 lb 8 oz) Filed Weights   06/14/15 2116 06/23/15 0548 06/23/15 1041  Weight: 63.64 kg (140 lb 4.8 oz) 67.722 kg (149 lb 4.8 oz) 65.681 kg (144 lb 12.8 oz)    Intake/Output:    Intake/Output Summary (Last 24 hours) at 06/24/15 1101 Last data filed at 06/24/15 0901  Gross per 24 hour  Intake 1823.83 ml  Output      0 ml  Net 1823.83 ml       Physical Exam: General:  elderly, frail   HEENT  moist oral mucous membranes   Neck  supple   Pulm/lungs  normal effort, clear anteriorly and laterally   CVS/Heart  irregular rhythm, 2/6 SEM   Abdomen:   soft, nontender, nondistended   Extremities:  trace b/l LE edema.  Neurologic:  awake, alert, follows commands  Skin:  no rashes          Basic Metabolic Panel:   Recent Labs Lab 06/19/15 0451 06/20/15 0532 06/21/15 0517 06/22/15 0808 06/23/15 0453  NA 144 144 141 139 140  K 3.6 3.5 3.4* 4.2 3.5  CL 115* 113* 111 111 110  CO2 22 22 26 22 24   GLUCOSE 96 92 95 102* 72  BUN 63* 51* 42* 30* 29*  CREATININE 2.61* 2.15* 1.91* 1.46* 1.60*  CALCIUM 8.6* 8.7* 8.7* 8.5* 8.3*     CBC: No results for input(s): WBC, NEUTROABS, HGB, HCT, MCV, PLT in the last 168 hours.    Microbiology:  Recent Results (from the past 720 hour(s))  Blood culture (single)     Status: None   Collection Time: 06/14/15  4:01 PM  Result Value Ref Range Status   Specimen Description BLOOD LEFT AC  Final   Special Requests BOTTLES DRAWN AEROBIC AND ANAEROBIC  1CC  Final   Culture NO GROWTH 6 DAYS  Final   Report Status 06/20/2015 FINAL  Final  Urine  culture     Status: None   Collection Time: 06/14/15  4:34 PM  Result Value Ref Range Status   Specimen Description URINE, CATHETERIZED  Final   Special Requests NONE  Final   Culture 5,000 COLONIES/mL GRAM POSITIVE RODS  Final   Report Status 06/18/2015 FINAL  Final    Coagulation Studies: No results for input(s): LABPROT, INR in the last 72 hours.  Urinalysis: No results for input(s): COLORURINE, LABSPEC, PHURINE, GLUCOSEU, HGBUR, BILIRUBINUR, KETONESUR, PROTEINUR, UROBILINOGEN, NITRITE, LEUKOCYTESUR in the last 72 hours.  Invalid input(s): APPERANCEUR    Imaging: No results found.   Medications:   . sodium chloride 50 mL/hr at 06/23/15 2050   . antiseptic oral rinse  7 mL Mouth Rinse q12n4p  . chlorhexidine  15 mL Mouth Rinse BID   acetaminophen **OR** acetaminophen, glycopyrrolate  Assessment/ Plan:  81 y.o. African-American female female with medical problems of dementia, chronic systolic congestive heart failure with EF 45-50%, moderate mitral regurgitation, dilated right ventricle, reduced right ventricular systolic function, severe tricuspid regurgitation, hypertension, chronic kidney disease, coronary disease with stent/PCN 2005,, who was admitted from nursing home [facility] 06[facility]14/2015 for  evaluation of severe hyponatremia, acute renal failure and acute encephalopathy.  1. Acute renal failure on chronic kidney disease stage III Baseline creatinine Is 1.45/GFR of 35 Acute renal failure is likely secondary to ATN from volume depletion. Renal U/S neg for obstruction Patient was getting furosemide, Cozaar, spironolactone, metoprolol as outpatient -no new renal function testing this a.m. Patient appears to be doing well at the moment however.  We will continue the patient on hydration while she remains here as it is very likely that she will develop dehydration and volume loss once we stop IV fluids.  2. Severe hypernatremia. serum sodium normalized with the use of  D5W.  The patient will continue on half-normal saline for now.  Continue to periodically monitor sodium.  3. Altered mental status Cause is not clear, but likely severe dehydration -patient appears to be at her baseline mental status at the moment.  She is interactive but remains confused.   LOS: 10 Rilea Arutyunyan 11/27/201611:01 AM

## 2015-06-25 LAB — BASIC METABOLIC PANEL
Anion gap: 5 (ref 5–15)
BUN: 17 mg/dL (ref 6–20)
CALCIUM: 8.2 mg/dL — AB (ref 8.9–10.3)
CHLORIDE: 112 mmol/L — AB (ref 101–111)
CO2: 23 mmol/L (ref 22–32)
CREATININE: 1.11 mg/dL — AB (ref 0.44–1.00)
GFR calc non Af Amer: 41 mL/min — ABNORMAL LOW (ref >60)
GFR, EST AFRICAN AMERICAN: 48 mL/min — AB (ref >60)
GLUCOSE: 89 mg/dL (ref 65–99)
Potassium: 3.6 mmol/L (ref 3.5–5.1)
Sodium: 140 mmol/L (ref 135–145)

## 2015-06-25 NOTE — NC FL2 (Signed)
  Kimberly Acosta MEDICAID [id number] LEVEL OF CARE SCREENING TOOL     IDENTIFICATION  Patient Name: Kimberly Acosta Birthdate: February 16, 1920 Sex: female Admission Date (Current Location): 06/14/2015  [facility] [place] [id number]   Facility and Address:  [facility], [address], [place], [place] [address]      Provider Number: [id number]  Attending Physician Name and Address:  Kimberly Highlandichard Wieting, MD  Relative Name and Phone Number:       Current Level of Care: Hospital Recommended Level of Care: Assisted Living Facility Prior Approval Number:    Date Approved/Denied:   PASRR Number:    Discharge Plan:  (ALF)    Current Diagnoses: Patient Active Problem List   Diagnosis Date Noted  . ARF (acute renal failure) (HCC)   . Acute hypernatremia   . Hypernatremia 06/14/2015  . CHF, acute on chronic (HCC) 03/29/2015    Orientation ACTIVITIES/SOCIAL BLADDER RESPIRATION       Passive Incontinent Normal  BEHAVIORAL SYMPTOMS/MOOD NEUROLOGICAL BOWEL NUTRITION STATUS   (none)  (none) Incontinent Diet  PHYSICIAN VISITS COMMUNICATION OF NEEDS Height & Weight Skin  30 days Verbally   144 lbs. Normal          AMBULATORY STATUS RESPIRATION    Assist extensive Normal      Personal Care Assistance Level of Assistance  Total care       Total Care Assistance: Maximum assistance    Functional Limitations Info  Hearing, Speech   Hearing Info: Impaired Speech Info: Impaired       SPECIAL CARE FACTORS FREQUENCY                      Additional Factors Info                  Please see discharge summary for a list of discharge medications.  DISCHARGE MEDICATIONS:   Current Discharge Medication List    CONTINUE these medications which have NOT CHANGED   Details  aspirin EC 81 MG tablet Take 81 mg by mouth daily.      STOP taking these medications     ENSURE (ENSURE)      furosemide (LASIX) 40 MG tablet       losartan (COZAAR) 50 MG tablet      metoprolol tartrate (LOPRESSOR) 25 MG tablet      spironolactone (ALDACTONE) 25 MG tablet                Relevant Imaging Results:  Relevant Lab Results:  Recent Labs    Additional Information    Kimberly SpanielMonica Nysia Dell, LCSW

## 2015-06-25 NOTE — Progress Notes (Signed)
Pt code status is now FULL code. RN, Deno LungerAlecia Jigar Zielke, called Son Patriciaann Clan(Edward [facility]rba) to verify code status around 10:00 this morning. Patient was listed as DNR, which no paper documentation was found for DNR in patient chart. Mr. Senaida OresRichardson stated patient is a FULL code over the phone. RN paged and spoke to Dr. Renae GlossWieting. Code status now changed back to FULL code from DNR.

## 2015-06-25 NOTE — Clinical Social Work Note (Signed)
Much time spent with patient's case today attempting to arrange a safe discharge plan. Once decision for rehab was made, patient's son requested [place]. CSW sent [place] to facilities [place] [facility] and [address] [place]. [facility] bed offer [facility] [place] was received and a bed offer from [facility] [place] [address] was received. Patient's son was contacted via phone and explained in detail to and patient's son stated that he was going to go by the facility in [place] to see if he liked it. Patient will be on medicare for the remainder of November and convert back to her [facility] [employer] on Dec. 1. Both offering facilities are aware of this and can still offer and will follow up with [facility] on Dec 1st to obtain auth. Discharge not foreseen today. MD notified and will expect discharge tomorrow morning. York SpanielMonica Cathleen Yagi MSW,LCSW [phone]

## 2015-06-25 NOTE — Clinical Social Work Note (Signed)
MD to discharge patient today to [facility]. According to MD, the son has chosen against a PEG. CSW contacted Hazel [facility] and has informed her of this and she is going to come and evaluate patient today at hospital before 12 to see if they will take patient back today.  York SpanielMonica Hailynn Slovacek MSW,LCSW [phone]

## 2015-06-25 NOTE — Discharge Instructions (Signed)
Dysphagia 1 with honey thickened liquids Acute Kidney Injury Acute kidney injury is any condition in which there is sudden (acute) damage to the kidneys. Acute kidney injury was previously known as acute kidney failure or acute renal failure. The kidneys are two organs that lie on either side of the spine between the middle of the back and the front of the abdomen. The kidneys:  Remove wastes and extra water from the blood.   Produce important hormones. These help keep bones strong, regulate blood pressure, and help create red blood cells.   Balance the fluids and chemicals in the blood and tissues. A small amount of kidney damage may not cause problems, but a large amount of damage may make it difficult or impossible for the kidneys to work the way they should. Acute kidney injury may develop into long-lasting (chronic) kidney disease. It may also develop into a life-threatening disease called end-stage kidney disease. Acute kidney injury can get worse very quickly, so it should be treated right away. Early treatment may prevent other kidney diseases from developing. CAUSES   A problem with blood flow to the kidneys. This may be caused by:   Blood loss.   Heart disease.   Severe burns.   Liver disease.  Direct damage to the kidneys. This may be caused by:  Some medicines.   A kidney infection.   Poisoning or consuming toxic substances.   A surgical wound.   A blow to the kidney area.   A problem with urine flow. This may be caused by:   Cancer.   Kidney stones.   An enlarged prostate. SIGNS AND SYMPTOMS   Swelling (edema) of the legs, ankles, or feet.   Tiredness (lethargy).   Nausea or vomiting.   Confusion.   Problems with urination, such as:   Painful or burning feeling during urination.   Decreased urine production.   Frequent accidents in children who are potty trained.   Bloody urine.   Muscle twitches and cramps.    Shortness of breath.   Seizures.   Chest pain or pressure. Sometimes, no symptoms are present. DIAGNOSIS Acute kidney injury may be detected and diagnosed by tests, including blood, urine, imaging, or kidney biopsy tests.  TREATMENT Treatment of acute kidney injury varies depending on the cause and severity of the kidney damage. In mild cases, no treatment may be needed. The kidneys may heal on their own. If acute kidney injury is more severe, your health care provider will treat the cause of the kidney damage, help the kidneys heal, and prevent complications from occurring. Severe cases may require a procedure to remove toxic wastes from the body (dialysis) or surgery to repair kidney damage. Surgery may involve:   Repair of a torn kidney.   Removal of an obstruction. HOME CARE INSTRUCTIONS  Follow your prescribed diet.  Take medicines only as directed by your health care provider.  Do not take any new medicines (prescription, over-the-counter, or nutritional supplements) unless approved by your health care provider. Many medicines can worsen your kidney damage or may need to have the dose adjusted.   Keep all follow-up visits as directed by your health care provider. This is important.  Observe your condition to make sure you are healing as expected. SEEK IMMEDIATE MEDICAL CARE IF:  You are feeling ill or have severe pain in the back or side.   Your symptoms return or you have new symptoms.  You have any symptoms of end-stage kidney disease. These include:  Persistent itchiness.   Loss of appetite.   Headaches.   Abnormally dark or light skin.  Numbness in the hands or feet.   Easy bruising.   Frequent hiccups.   Menstruation stops.   You have a fever.  You have increased urine production.  You have pain or bleeding when urinating. MAKE SURE YOU:   Understand these instructions.  Will watch your condition.  Will get help right away if  you are not doing well or get worse.   This information is not intended to replace advice given to you by your health care provider. Make sure you discuss any questions you have with your health care provider.   Document Released: 01/27/2011 Document Revised: 08/04/2014 Document Reviewed: 03/12/2012 Elsevier Interactive Patient Education [employer]2016 Elsevier Inc.

## 2015-06-25 NOTE — Progress Notes (Signed)
Subjective:   Creatinine currently down to 1.11 which is below her baseline. BUN also down to 17. It appears that her prior nursing home will not be taking her back.  Objective:  Vital signs in last 24 hours:  Temp:  [97.5 F (36.4 C)-97.7 F (36.5 C)] 97.7 F (36.5 C) (11/28 0900) Pulse Rate:  [62-69] 69 (11/28 0900) Resp:  [18-20] 18 (11/28 0428) BP: (121-136)/(66-69) 136/69 mmHg (11/28 0900) SpO2:  [100 %] 100 % (11/28 0900)  Weight change:  Filed Weights   06/14/15 2116 06/23/15 0548 06/23/15 1041  Weight: 63.64 kg (140 lb 4.8 oz) 67.722 kg (149 lb 4.8 oz) 65.681 kg (144 lb 12.8 oz)    Intake/Output:    Intake/Output Summary (Last 24 hours) at 06/25/15 1412 Last data filed at 06/25/15 0915  Gross per 24 hour  Intake 1546.34 ml  Output      0 ml  Net 1546.34 ml       Physical Exam: General:  elderly, frail   HEENT  moist oral mucous membranes   Neck  supple   Pulm/lungs  normal effort, clear anteriorly and laterally   CVS/Heart  irregular rhythm, 2/6 SEM   Abdomen:   soft, nontender, nondistended   Extremities:  trace b/l LE edema.  Neurologic:  awake, alert, follows commands  Skin:  no rashes          Basic Metabolic Panel:   Recent Labs Lab 06/20/15 0532 06/21/15 0517 06/22/15 0808 06/23/15 0453 06/25/15 0643  NA 144 141 139 140 140  K 3.5 3.4* 4.2 3.5 3.6  CL 113* 111 111 110 112*  CO2 GLUCOSE 92 95 102* 72 89  BUN 51* 42* 30* 29* 17  CREATININE 2.15* 1.91* 1.46* 1.60* 1.11*  CALCIUM 8.7* 8.7* 8.5* 8.3* 8.2*     CBC: No results for input(s): WBC, NEUTROABS, HGB, HCT, MCV, PLT in the last 168 hours.    Microbiology:  Recent Results (from the past 720 hour(s))  Blood culture (single)     Status: None   Collection Time: 06/14/15  4:01 PM  Result Value Ref Range Status   Specimen Description BLOOD LEFT AC  Final   Special Requests BOTTLES DRAWN AEROBIC AND ANAEROBIC  1CC  Final   Culture NO GROWTH 6 DAYS  Final   Report Status 06/20/2015 FINAL  Final  Urine culture     Status: None   Collection Time: 06/14/15  4:34 PM  Result Value Ref Range Status   Specimen Description URINE, CATHETERIZED  Final   Special Requests NONE  Final   Culture 5,000 COLONIES/mL GRAM POSITIVE RODS  Final   Report Status 06/18/2015 FINAL  Final    Coagulation Studies: No results for input(s): LABPROT, INR in the last 72 hours.  Urinalysis: No results for input(s): COLORURINE, LABSPEC, PHURINE, GLUCOSEU, HGBUR, BILIRUBINUR, KETONESUR, PROTEINUR, UROBILINOGEN, NITRITE, LEUKOCYTESUR in the last 72 hours.  Invalid input(s): APPERANCEUR    Imaging: No results found.   Medications:     . antiseptic oral rinse  7 mL Mouth Rinse q12n4p  . chlorhexidine  15 mL Mouth Rinse BID   acetaminophen **OR** acetaminophen, glycopyrrolate  Assessment/ Plan:  81 y.o. African-American female female with medical problems of dementia, chronic systolic congestive heart failure with EF 45-50%, moderate mitral regurgitation, dilated right ventricle, reduced right ventricular systolic function, severe tricuspid regurgitation, hypertension, chronic kidney disease, coronary disease with stent/PCN 2005,, who was admitted from nursing home to [facility] on  06/14/2015 for evaluation of severe hyponatremia, acute renal failure and acute encephalopathy.  1. Acute renal failure on chronic kidney disease stage III Baseline creatinine Is 1.45/GFR of 35 Acute renal failure is likely secondary to ATN from volume depletion. Renal U/S neg for obstruction Patient was getting furosemide, Cozaar, spironolactone, metoprolol as outpatient -creatinine currently down to 79 which is actually below her prior baseline.  Patient now taken off of IV fluids which is acceptable.  2. Severe hypernatremia. Sodium stable at 140.  Patient has been taken off of IV fluids now.  3. Altered mental status Cause is not clear, but likely severe dehydration -patient remains  pleasantly confused.  Able to follow commands at present. This appears to be her baseline now.   LOS: 11 Danyeal Akens 11/28/20162:12 PM

## 2015-06-25 NOTE — Progress Notes (Signed)
Visit made. Patient seen sitting up in the recliner, eyes closed, easily awakened to voice. Lunch tray untouched on over bed table. Patient repositioned with a  Pillow as she was l;eaning heavily to the left. She did then take 3 small bites of magic cup, no cough noted. She declined any liquids and fell back asleep. Son Ed not present. Writer spoke with CSW MakandaMonica, current plan is for patient to be discharged to a skilled nursing facility, she is currently doing a bed [facility] [place] [facility] [place]. IV fluids have been discontinued. Patient will need to revoke Hospice services if she is discharged to skilled nursing for rehab. Will continue to follow and update hospice team.  Dayna BarkerKaren Robertson RN, BSN, [facility] [place], [facility] [phone] c

## 2015-06-25 NOTE — Progress Notes (Signed)
Patient ID: Kimberly EwingMaggie Acosta, female   DOB: 1920/03/23, 81 y.o.   MRN: [id number] Occasional chest pain [facility] PROGRESS NOTE  PCP: [facility]  HPI/Subjective: Patient seen earlier this morning and was stable for discharge. Unfortunately the facility that she is at does not want her back. Patient did not offer any complaints this morning.  Objective: Filed Vitals:   06/25/15 0900 06/25/15 1247  BP: 136/69 122/60  Pulse: 69 66  Temp: 97.7 F (36.5 C) 97.7 F (36.5 C)  Resp:  18    Filed Weights   06/14/15 2116 06/23/15 0548 06/23/15 1041  Weight: 63.64 kg (140 lb 4.8 oz) 67.722 kg (149 lb 4.8 oz) 65.681 kg (144 lb 12.8 oz)    ROS: Review of Systems  Respiratory: Negative for shortness of breath.   Cardiovascular: Negative for chest pain.  Gastrointestinal: Negative for nausea, abdominal pain and diarrhea.  Neurological: Negative for headaches.   Limited with dementia  Exam: Physical Exam  HENT:  Nose: No mucosal edema.  Mouth/Throat: No oropharyngeal exudate or posterior oropharyngeal edema.  Eyes: Conjunctivae, EOM and lids are normal. Pupils are equal, round, and reactive to light.  Neck: No JVD present. Carotid bruit is not present. No edema present. No thyroid mass and no thyromegaly present.  Cardiovascular: S1 normal and S2 normal.  Exam reveals no gallop.   No murmur heard. Pulses:      Dorsalis pedis pulses are 2+ on the right side, and 2+ on the left side.  Respiratory: No respiratory distress. She has no wheezes. She has no rhonchi. She has no rales.  GI: Soft. Bowel sounds are normal. There is no tenderness.  Musculoskeletal:       Right ankle: She exhibits swelling.       Left ankle: She exhibits swelling.  Lymphadenopathy:    She has no cervical adenopathy.  Neurological: She is alert.  Skin: Skin is warm. No rash noted. Nails show no clubbing.  Psychiatric: She has a normal mood and affect.    Data  Reviewed: Basic Metabolic Panel:  Recent Labs Lab 06/20/15 0532 06/21/15 0517 06/22/15 0808 06/23/15 0453 06/25/15 0643  NA 144 141 139 140 140  K 3.5 3.4* 4.2 3.5 3.6  CL 113* 111 111 110 112*  CO2 22 26 22 24 23   GLUCOSE 92 95 102* 72 89  BUN 51* 42* 30* 29* 17  CREATININE 2.15* 1.91* 1.46* 1.60* 1.11*  CALCIUM 8.7* 8.7* 8.5* 8.3* 8.2*   BNP (last 3 results)  Recent Labs  03/29/15 1547 03/30/15 0416  BNP 1402.0* 1393.0*    Assessment/Plan:  1. Acute renal failure on chronic kidney disease- creatinine improved with hydration to 1.1 today 2. Severe hypernatremia- improved from 167 down to 140  3. Acute encephalopathy- improved from admission 4. Dementia without behavioral disturbance 5. Failure to thrive- high risk for dehydration.  Code Status:     Code Status Orders        Start     Ordered   06/14/15 1943  Full code   Continuous     06/14/15 1944    Advance Directive Documentation        Most Recent Value   Type of Advance Directive  Healthcare Power of Attorney   Pre-existing out of facility DNR order (yellow form or pink MOST form)     "MOST" Form in Place?       Family Communication: Spoke with son on the phone this a.m. Disposition Plan:  Unfortunately the facility that she is living at refused to take her back today. Social worker looking into other options. Son wants to visit the facilities before making a decision.  Consultants:  Nephrology  Palliative care  Time spent: 40 minutes.  Alford Highland  [facility]

## 2015-06-25 NOTE — Evaluation (Signed)
Physical Therapy Evaluation Patient Details Name: Kimberly EwingMaggie Acosta MRN: [id number] DOB: 11-08-1919 Today's Date: 06/25/2015   History of Present Illness  Pt is admitted for hypernatremia and failure to thrive. Pt with history of dementia, CHF, and HTN. [facility].  Clinical Impression  Pt is a pleasantly confused 81 year old female who was admitted for failure to thrive. Most of history obtained from chart review. Pt on room air upon arrival with sats at 86%; HR 116. 2L of O2 donned for all mobility with sats improving to 97%; HR 86. Pt sitting in recliner upon arrival and is able to participate in standing with +2 assist and perform limited marching in place. Pt able to follow directions for seated there-ex as well including B LE hip abd/add and SLRs. Pt fatigues quickly. Pt demonstrates deficits with strength/endurance/mobility. Would benefit from skilled PT to address above deficits and promote optimal return to PLOF.      Follow Up Recommendations SNF    Equipment Recommendations       Recommendations for Other Services       Precautions / Restrictions Precautions Precautions: None Restrictions Weight Bearing Restrictions: No      Mobility  Bed Mobility               General bed mobility comments: not performed as pt currently sitting in recliner  Transfers Overall transfer level: Needs assistance Equipment used: 2 person hand held assist Transfers: Sit to/from Stand Sit to Stand: Max assist;+2 physical assistance         General transfer comment: sit<>Stand performed with HHA and pt able to weight bear on B feet. Pt then able to stand and pick her feet up with cues in order to march in place  Ambulation/Gait             General Gait Details: unable at this time secondary to weakness  Stairs            Wheelchair Mobility    Modified Rankin (Stroke Patients Only)       Balance Overall balance assessment:  Needs assistance Sitting-balance support: Bilateral upper extremity supported Sitting balance-Leahy Scale: Fair     Standing balance support: Bilateral upper extremity supported Standing balance-Leahy Scale: Poor                               Pertinent Vitals/Pain Pain Assessment: No/denies pain    Home Living Family/patient expects to be discharged to:: Skilled nursing facility                      Prior Function Level of Independence: Needs assistance         Comments: Pt at baseline is able to perform stand-pivot to wheelchair where she resides most of the day. History obtained from CSW as pt with unreliable history secondary to dementia.     Hand Dominance        Extremity/Trunk Assessment   Upper Extremity Assessment: Generalized weakness (B UE grossly 3/5)           Lower Extremity Assessment: Generalized weakness (B LE grossly 2+/5)         Communication   Communication: No difficulties  Cognition Arousal/Alertness: Awake/alert Behavior During Therapy: WFL for tasks assessed/performed Overall Cognitive Status: History of cognitive impairments - at baseline  General Comments      Exercises        Assessment/Plan    PT Assessment Patient needs continued PT services  PT Diagnosis Difficulty walking;Generalized weakness   PT Problem List Decreased strength;Decreased balance  PT Treatment Interventions Therapeutic exercise;Therapeutic activities;Balance training;Wheelchair mobility training   PT Goals (Current goals can be found in the Care Plan section) Acute Rehab PT Goals Patient Stated Goal: unable to participate in goal setting at this time PT Goal Formulation: Patient unable to participate in goal setting Time For Goal Achievement: 07/09/15 Potential to Achieve Goals: Fair    Frequency Min 2X/week   Barriers to discharge        Co-evaluation               End of Session  Equipment Utilized During Treatment: Gait belt Activity Tolerance: Patient tolerated treatment well Patient left: in chair Nurse Communication: Mobility status         Time: 1610-9604 PT Time Calculation (min) (ACUTE ONLY): 14 min   Charges:   PT Evaluation $Initial PT Evaluation Tier I: 1 Procedure     PT G Codes:        Kimberly Acosta 2015-07-11, 3:32 PM  Kimberly Acosta, PT, DPT [phone]

## 2015-06-25 NOTE — Discharge Summary (Addendum)
[facility] - [place] [facility]   PATIENT NAME: Kimberly EwingMaggie Mentzer    MR#:  [id number]  DATE OF BIRTH:  1919/12/15  DATE OF ADMISSION:  06/14/2015 ADMITTING PHYSICIAN: Alford Highlandichard Miche Loughridge, MD  DATE OF DISCHARGE: 06/26/2015  PRIMARY CARE PHYSICIAN[facility]    ADMISSION DIAGNOSIS:  Acute hypernatremia [E87.0] Hyperchloremia [E87.8] Acute renal failure, unspecified acute renal failure type (HCC) [N17.9]  DISCHARGE DIAGNOSIS:  Active Problems:   Hypernatremia   Acute hypernatremia   ARF (acute renal failure) (HCC)   SECONDARY DIAGNOSIS:   Past Medical History  Diagnosis Date  . CHF (congestive heart failure) (HCC)   . Hypertension   . Renal disorder     Chronic Kidney Disease  . Alzheimer disease   . Dementia   . S/P coronary artery stent placement 2006  . Coronary artery disease     PCI and stent to LAD in 2005    HOSPITAL COURSE:   1. Acute renal failure on chronic kidney disease stage II. The patient has been on IV fluid hydration during the entire hospital course and creatinine has improved to 1.1 with a GFR of 48. 2. Severe hypernatremia the patient was admitted with a sodium of 167. This in and of itself has a poor prognosis. With IV fluid hydration sodium came down to 140. 3. Failure to thrive- patient was nothing by mouth for quite a bit of the hospital course until her mental status improved enough to take in a diet. She is on dysphagia 1 diet with honey thick liquids. Patient is high risk for dehydration and this to recur. Son didn't want a PEG. 4. Dementia without behavioral disturbance- not eating can be part of the clinical course with dementia. 5. Acute encephalopathy on presentation with unresponsiveness. This likely due to the severe hypernatremia. This has improved back to patient's baseline. 6. Chronic hypoxic respiratory failure on chronic oxygen. 7. Follows with hospice back at the facility. The patient's  son may request hydration if she becomes dehydrated again. 8. No signs of congestive heart failure on this hospital course. The patient was hydrated the entire hospital stay. Would not give any cardiac medications upon discharge.   DISCHARGE CONDITIONS:   Guarded  CONSULTS OBTAINED:  Treatment Team:  Mosetta PigeonHarmeet Singh, MD  DRUG ALLERGIES:  No Known Allergies  DISCHARGE MEDICATIONS:   Current Discharge Medication List    CONTINUE these medications which have NOT CHANGED   Details  aspirin EC 81 MG tablet Take 81 mg by mouth daily.      STOP taking these medications     ENSURE (ENSURE)      furosemide (LASIX) 40 MG tablet      losartan (COZAAR) 50 MG tablet      metoprolol tartrate (LOPRESSOR) 25 MG tablet      spironolactone (ALDACTONE) 25 MG tablet          DISCHARGE INSTRUCTIONS:      If you experience worsening of your admission symptoms, develop shortness of breath, life threatening emergency, suicidal or homicidal thoughts you must seek medical attention immediately by calling 911 or calling your MD immediately  if symptoms less severe.  You Must read complete instructions/literature along with all the possible adverse reactions/side effects for all the Medicines you take and that have been prescribed to you. Take any new Medicines after you have completely understood and accept all the possible adverse reactions/side effects.   Please note  You were cared for by a hospitalist  during your hospital stay. If you have any questions about your discharge medications or the care you received while you were in the hospital after you are discharged, you can call the unit and asked to speak with the hospitalist on call if the hospitalist that took care of you is not available. Once you are discharged, your primary care physician will handle any further medical issues. Please note that NO REFILLS for any discharge medications will be authorized once you are discharged, as it  is imperative that you return to your primary care physician (or establish a relationship with a primary care physician if you do not have one) for your aftercare needs so that they can reassess your need for medications and monitor your lab values.    Today   CHIEF COMPLAINT:   Chief Complaint  Patient presents with  . Failure To Thrive    HISTORY OF PRESENT ILLNESS:  Kimberly Acosta  is a 81 y.o. female brought in with altered mental status and unresponsiveness and found to be in acute renal failure and severe hypernatremia   VITAL SIGNS:  Blood pressure 126/66, pulse 62, temperature 97.5 F (36.4 C), temperature source Oral, resp. rate 18, height 5' (1.524 m), weight 65.681 kg (144 lb 12.8 oz), SpO2 100 %.    PHYSICAL EXAMINATION:  GENERAL:  81 y.o.-year-old patient lying in the bed with no acute distress.  EYES: Pupils equal, round, reactive to light and accommodation. No scleral icterus. Extraocular muscles intact.  HEENT: Head atraumatic, normocephalic. Oropharynx and nasopharynx clear.  NECK:  Supple, no jugular venous distention. No thyroid enlargement, no tenderness.  LUNGS: Decreased breath sounds bilaterally, no wheezing, rales,rhonchi or crepitation. No use of accessory muscles of respiration.  CARDIOVASCULAR: S1, S2 normal. 2/6 systolic murmur, no rubs, or gallops.  ABDOMEN: Soft, non-tender, non-distended. Bowel sounds present. No organomegaly or mass.  EXTREMITIES: 2+ pedal edema, no cyanosis, or clubbing.  NEUROLOGIC: Cranial nerves II through XII are intact. Gait not checked.  PSYCHIATRIC: The patient is alert.  SKIN: No obvious rash, lesion, or ulcer.   DATA REVIEW:    Chemistries   Recent Labs Lab 06/25/15 0643  NA 140  K 3.6  CL 112*  CO2 23  GLUCOSE 89  BUN 17  CREATININE 1.11*  CALCIUM 8.2*     Microbiology Results  Results for orders placed or performed during the hospital encounter of 06/14/15  Blood culture (single)     Status: None    Collection Time: 06/14/15  4:01 PM  Result Value Ref Range Status   Specimen Description BLOOD LEFT AC  Final   Special Requests BOTTLES DRAWN AEROBIC AND ANAEROBIC  1CC  Final   Culture NO GROWTH 6 DAYS  Final   Report Status 06/20/2015 FINAL  Final  Urine culture     Status: None   Collection Time: 06/14/15  4:34 PM  Result Value Ref Range Status   Specimen Description URINE, CATHETERIZED  Final   Special Requests NONE  Final   Culture 5,000 COLONIES/mL GRAM POSITIVE RODS  Final   Report Status 06/18/2015 FINAL  Final    Management plans discussed with the patient, and son on 11/28 and they are in agreement. The patient's son actually thanked me for all my care during the hospital course and was very appreciative of the care here.   TOTAL TIME TAKING CARE OF THIS PATIENT: 35 minutes.    Alford Highland M.D on 06/25/2015 at 9:19 AM  Between 7am to 6pm -  Pager - [phone]  After 6pm go to [email or web address] - password [email or web address] [facility]  [facility]  Office  [phone]  CC: Primary care physician; [facility]

## 2015-06-25 NOTE — Clinical Social Work Note (Signed)
Kimberly Acosta with [facility] has informed this CSW and patient's son that they will not be able to take patient back. Kimberly [facility]osta at [facility] has known for a week that patient needed to be evaluated for return and knew that the patient's son was deciding upon a PEG versus comfort care, however CSW was informed last Friday by Kimberly BeaversHazel that their Librarian, academicxecutive Director would not be in town until today and would need to involve her in the decision. Kimberly BeaversHazel stated that that she spoke with their Librarian, academicxecutive Director and that they would only take patient back if patient's son agreed to make her a DNR which he would not do. After Kimberly BeaversHazel finished speaking with patient's son today, she handed her phone to this CSW and I spoke with patient's son to inform him that we will try to see if physical therapy could assess and make a recommendation but that patient's insurance [employer](medicare human[employer]) may not approve for rehab. Patient's son verbalized understanding and then requested that [facility] [place] of facilities that her referral is sent to. MD has been updated and is aware. Bedsearch initiated. York SpanielMonica Hersh Acosta MSW,LCSW [phone]

## 2015-06-26 NOTE — Clinical Social Work Note (Signed)
Patient's son left message for this CSW to inform that he has chosen [facility] in [place] for patient to transfer to. Confirmation and auth from [place] received and Milagros ReapMary Acosta with [facility] has informed CSW that patient can come today. CSW has arranged transport with EMS to [facility] for today. CSW has called patient's son and confirmed that he is aware patient to discharge today to [facility] and he is in agreement. [facility] this [facility]. MD is aware. York SpanielMonica  Xzavier Swinger MSW,LCSW [phone]

## 2015-06-26 NOTE — Clinical Social Work Placement (Signed)
   CLINICAL SOCIAL WORK PLACEMENT  NOTE  Date:  06/26/2015  Patient Details  Name: Kimberly Acosta MRN: [id number] Date of Birth: 02/20/1920  Clinical Social Work is seeking post-discharge placement for this patient at the   level of care (*CSW will initial, date and re-position this form in  chart as items are completed):      Patient/family provided with [facility] Clinical Social Work Department's list of facilities offering this level of care within the geographic area requested by the patient (or if unable, by the patient's family).      Patient/family informed of their freedom to choose among providers that offer the needed level of care, that participate in Medicare, Medicaid or managed care program needed by the patient, have an available bed and are willing to accept the patient.      Patient/family informed of [facility]'s ownership interest in [facility] and [facility], as well as of the fact that they are under no obligation to receive care at these facilities.  PASRR submitted to EDS on       PASRR number received on       Existing PASRR number confirmed on       FL2 transmitted to all facilities in geographic area requested by pt/family on       FL2 transmitted to all facilities within larger geographic area on       Patient informed that his/her managed care company has contracts with or will negotiate with certain facilities, including the following:            Patient/family informed of bed offers received.  Patient chooses bed at       Physician recommends and patient chooses bed at      Patient to be transferred to   on  .  Patient to be transferred to facility by       Patient family notified on   of transfer.  Name of family member notified:        PHYSICIAN       Additional Comment:    _______________________________________________ York SpanielMonica Kue Fox, LCSW 06/26/2015, 10:35 AM

## 2015-06-26 NOTE — Progress Notes (Signed)
Report given to Juanita,RN at [facility] in [place]. No questions received. Patient is alert and demented.  No signs of acute distress. Waiting for EMS to come and transport patient.

## 2015-06-26 NOTE — Progress Notes (Signed)
Patient ID: Kimberly Acosta, female   DOB: 08-08-1919, 81 y.o.   MRN: [id number] POccasional chest pain [facility] PROGRESS NOTE  PCP: [facility]  HPI/Subjective: Patient said she is in no pain, answers some yes or no questions  Objective: Filed Vitals:   06/25/15 2359 06/26/15 0417  BP: 141/70 130/90  Pulse: 59 54  Temp: 97.5 F (36.4 C) 97.3 F (36.3 C)  Resp: 14 14    Filed Weights   06/14/15 2116 06/23/15 0548 06/23/15 1041  Weight: 63.64 kg (140 lb 4.8 oz) 67.722 kg (149 lb 4.8 oz) 65.681 kg (144 lb 12.8 oz)    ROS: Review of Systems  Respiratory: Negative for shortness of breath.   Cardiovascular: Negative for chest pain.  Gastrointestinal: Negative for nausea, abdominal pain and diarrhea.  Neurological: Negative for headaches.   Limited with dementia  Exam: Physical Exam  HENT:  Nose: No mucosal edema.  Mouth/Throat: No oropharyngeal exudate or posterior oropharyngeal edema.  Eyes: Conjunctivae, EOM and lids are normal. Pupils are equal, round, and reactive to light.  Neck: No JVD present. Carotid bruit is not present. No edema present. No thyroid mass and no thyromegaly present.  Cardiovascular: S1 normal and S2 normal.  Exam reveals no gallop.   No murmur heard. Pulses:      Dorsalis pedis pulses are 2+ on the right side, and 2+ on the left side.  Respiratory: No respiratory distress. She has no wheezes. She has no rhonchi. She has no rales.  GI: Soft. Bowel sounds are normal. There is no tenderness.  Musculoskeletal:       Right ankle: She exhibits swelling.       Left ankle: She exhibits swelling.  Lymphadenopathy:    She has no cervical adenopathy.  Neurological: She is alert.  Skin: Skin is warm. No rash noted. Nails show no clubbing.  Psychiatric: She has a normal mood and affect.    Data Reviewed: Basic Metabolic Panel:  Recent Labs Lab 06/20/15 0532 06/21/15 0517 06/22/15 0808 06/23/15 0453 06/25/15 0643   NA 144 141 139 140 140  K 3.5 3.4* 4.2 3.5 3.6  CL 113* 111 111 110 112*  CO2 GLUCOSE 92 95 102* 72 89  BUN 51* 42* 30* 29* 17  CREATININE 2.15* 1.91* 1.46* 1.60* 1.11*  CALCIUM 8.7* 8.7* 8.5* 8.3* 8.2*   BNP (last 3 results)  Recent Labs  03/29/15 1547 03/30/15 0416  BNP 1402.0* 1393.0*    Assessment/Plan:  1. Acute renal failure on chronic kidney disease- creatinine improved with hydration to 1.1 yesterday. I did not order labs today 2. Severe hypernatremia- improved from 167 down to 140  3. Acute encephalopathy- improved from admission 4. Dementia without behavioral disturbance 5. Failure to thrive- high risk for dehydration. Dysphagia diet 6. On chronic oxygen with hospice   Code Status:     Code Status Orders        Start     Ordered   06/14/15 1943  Full code   Continuous     06/14/15 1944    Advance Directive Documentation        Most Recent Value   Type of Advance Directive  Healthcare Power of Attorney   Pre-existing out of facility DNR order (yellow form or pink MOST form)     "MOST" Form in Place?       Family Communication: Spoke with son yesterday Disposition Plan: Hopefully out of the hospital today  Consultants:  Nephrology  Palliative care  Time spent: 20 minutes.  Alford HighlandWIETING, [place]  [facility]

## 2015-06-26 NOTE — Plan of Care (Signed)
Problem: Health Behavior/Discharge Planning: Goal: Ability to manage health-related needs will improve Outcome: Not Progressing Correction- Pt is confused

## 2015-06-26 NOTE — Progress Notes (Signed)
Visit made, EMS present as patient is discharging to [facility] [place] today. Son Ed not present, has been contacted by CSW [place]Monica, [facility] his choice. Revocation of hospice benefit form signed by Ed last evening as patient will be pursuing rehab. Hospice team updated.  Dayna BarkerKaren Robertson, RN, BSN,CHPN,  Hospice and Palliative Care, [facility] [phone] c

## 2015-06-26 NOTE — Plan of Care (Signed)
Problem: Education: Goal: Knowledge of [facility] Education information/materials will improve Outcome: Not Progressing Pt is confused     Problem: Health Behavior/Discharge Planning: Goal: Ability to manage health-related needs will improve Outcome: Not Progressing Pt is NPO

## 2015-08-29 DEATH — deceased

## 2016-10-09 IMAGING — US US ABDOMEN COMPLETE
1 series · 13 of 25 positions shown · non-contrast
Comparison: 03/30/2015 chest CT

CLINICAL DATA: Acute renal failure low platelets. Suspected liver
disease.

EXAM:
ULTRASOUND ABDOMEN COMPLETE

[Series 1: us abdomen complete · 0.17mm/px · 13 of 114 slices shown]
[im 1/114]
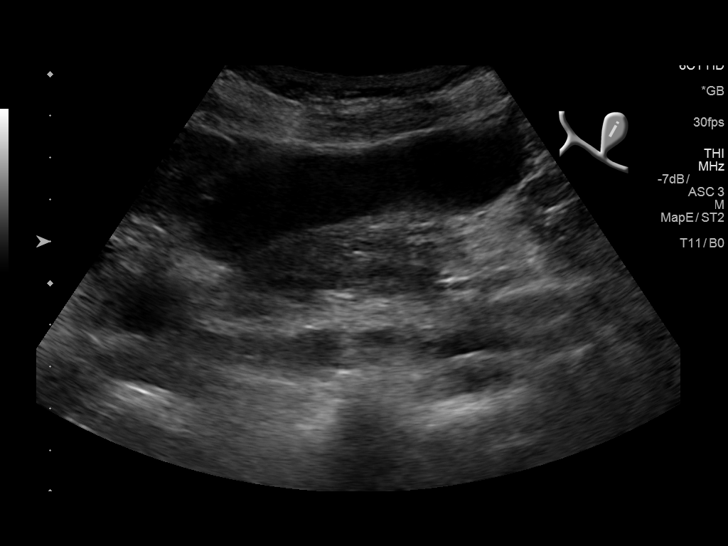
[im 10/114]
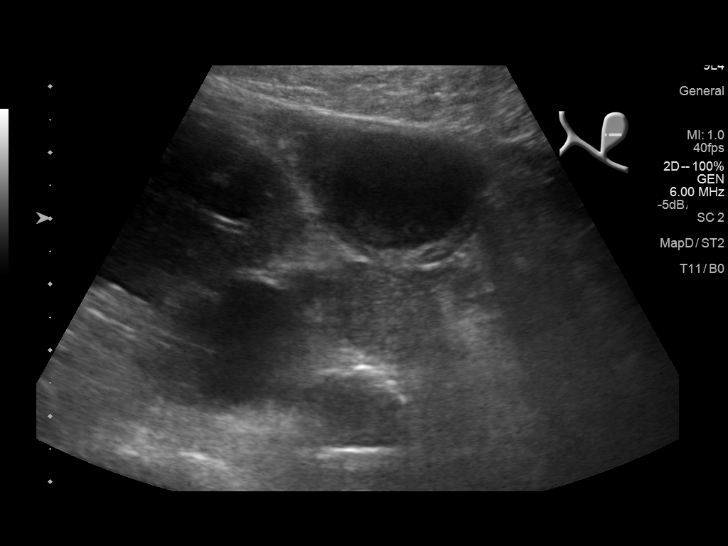
[im 19/114]
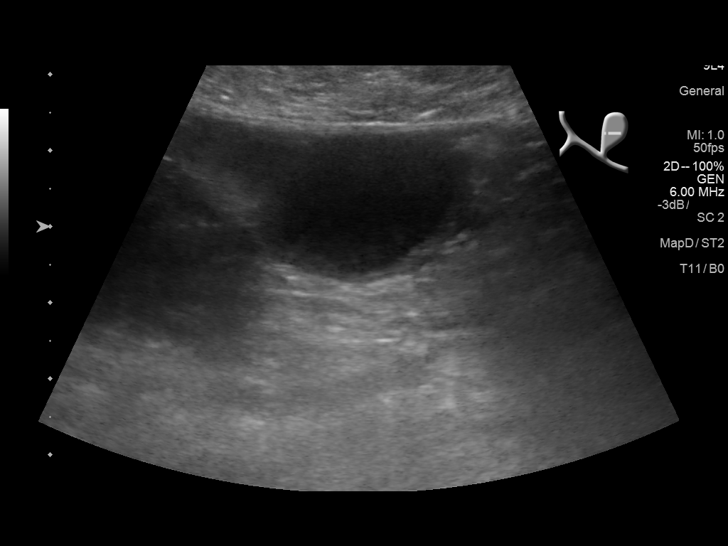
[im 29/114]
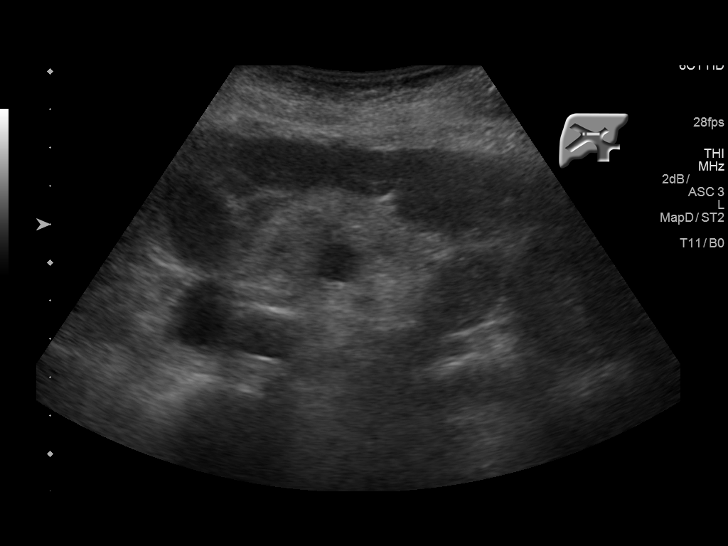
[im 38/114]
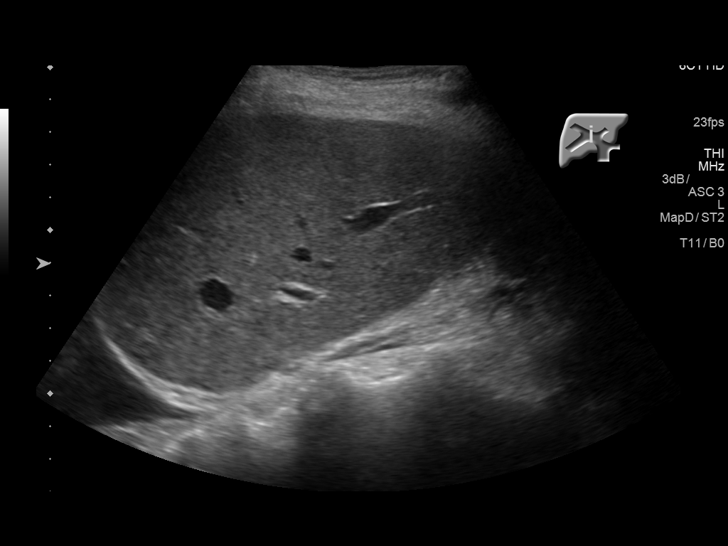
[im 48/114]
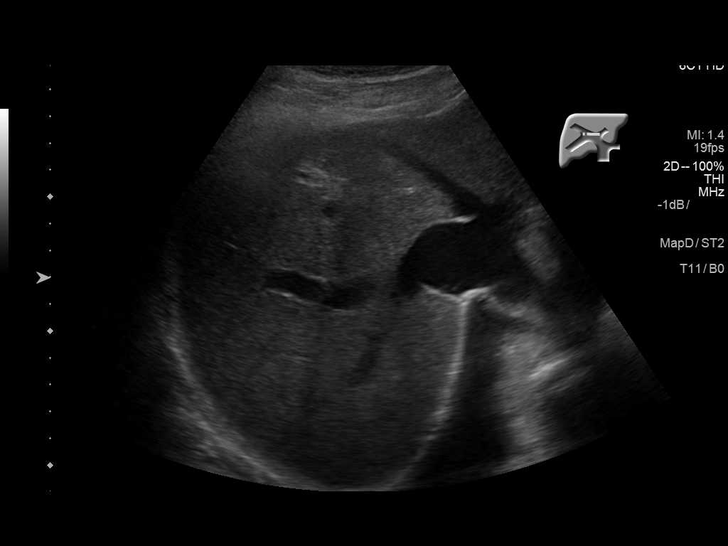
[im 57/114]
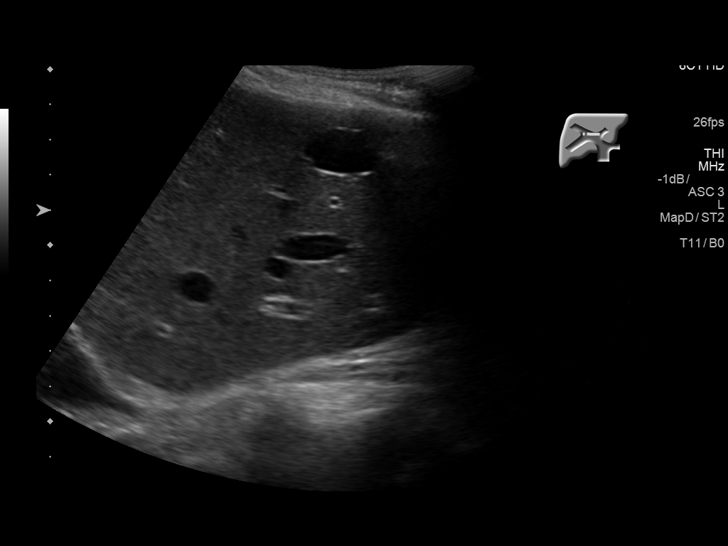
[im 66/114]
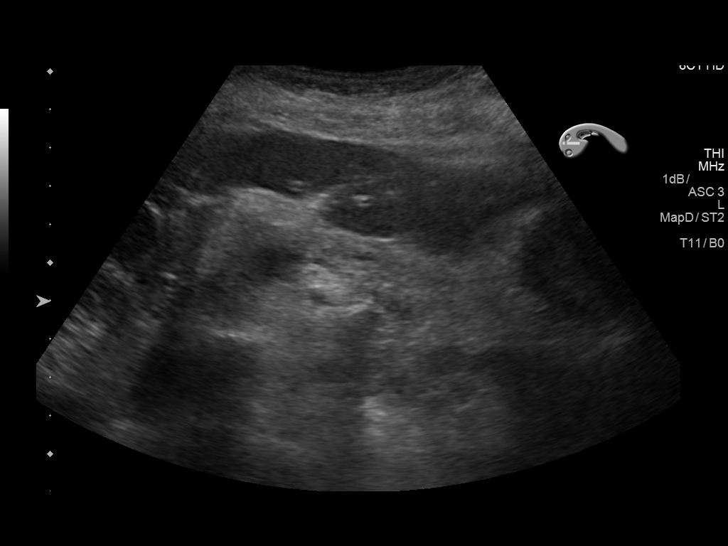
[im 76/114]
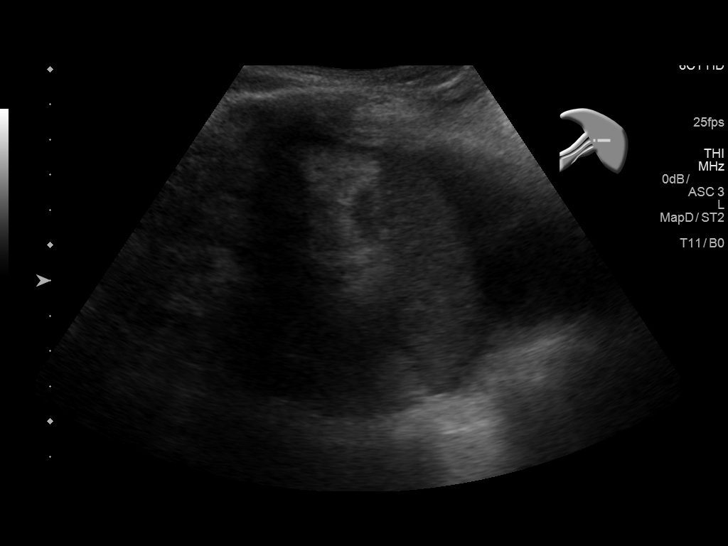
[im 85/114]
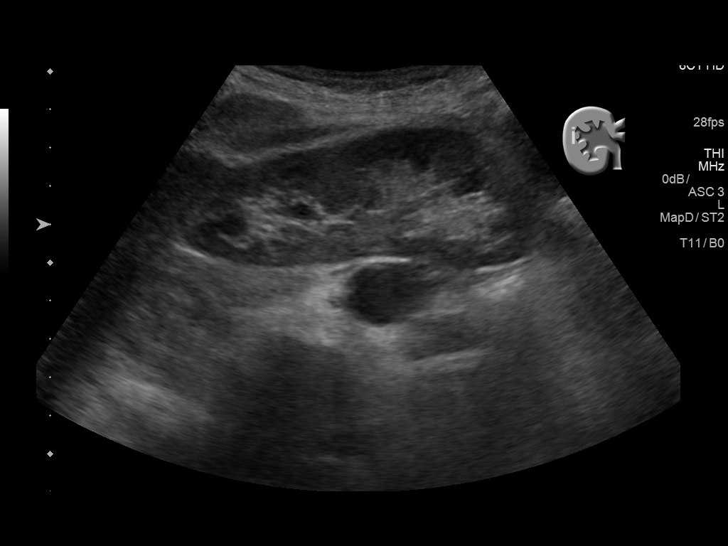
[im 95/114]
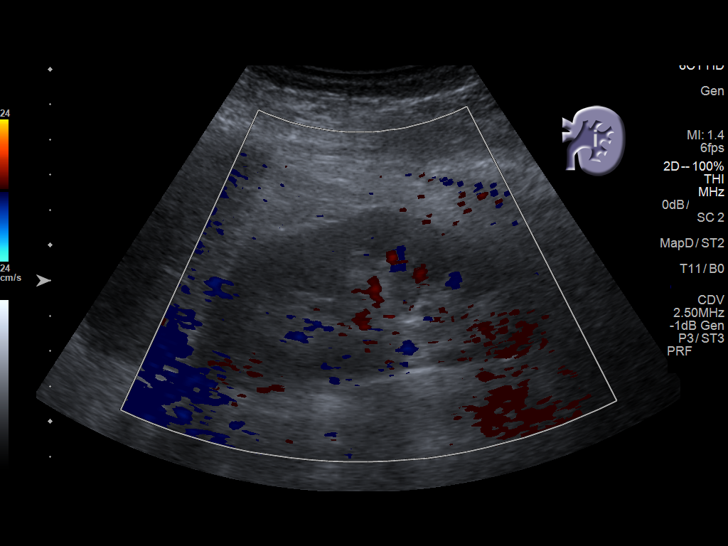
[im 104/114]
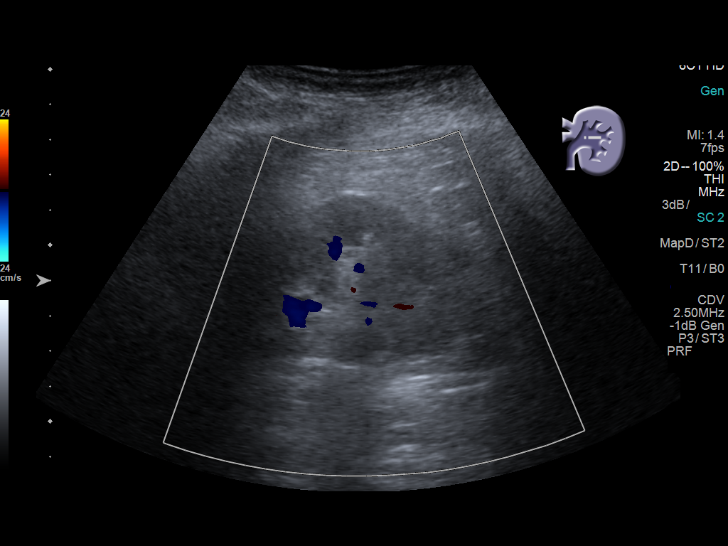
[im 114/114]
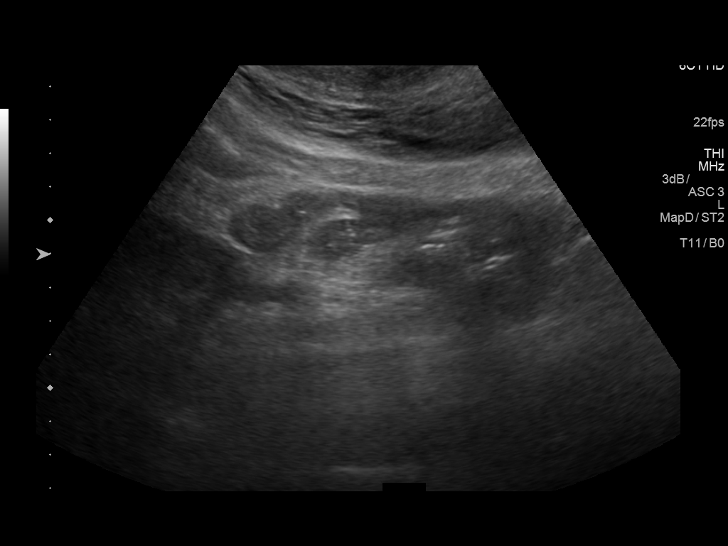

[13 of 25 positions shown; findings below may reference images not displayed]

FINDINGS: Gallbladder: No gallstones or wall thickening visualized. Possible
sludge. No sonographic Murphy sign noted.

Common bile duct: Diameter: 4 mm

Liver: Cystic lesion anteriorly in the right hepatic lobe 1.9 by
by 2.0 cm with enhanced through transmission. Within normal limits
in parenchymal echogenicity.

IVC: No abnormality visualized.

Pancreas: Visualized portion unremarkable. Dorsal pancreatic duct
upper normal size at 2 mm

Spleen: Size and appearance within normal limits.

Right Kidney: Length: 9.1 cm. Echogenicity within normal limits. No
mass or hydronephrosis visualized.

Left Kidney: Length: 9.7 cm. Echogenicity within normal limits. No
mass or hydronephrosis visualized.

Abdominal aorta: No aneurysm visualized. Bifurcation not seen due to
overlying bowel gas.

Other findings: Trace ascites in the right upper quadrant and left
upper quadrant. Bilateral pleural effusions.
IMPRESSION: 1. Possible sludge in the gallbladder but no gallstones or directly
demonstrated choledocholithiasis. No biliary dilatation.
2. Right hepatic lobe cyst.
3. Trace upper abdominal ascites.  Bilateral pleural effusions.
4. No hydronephrosis or stones identified.
5. Atherosclerotic calcification of the abdominal aorta, without
visualized aneurysm.

## 2017-02-23 IMAGING — CT CT CHEST W/O CM
1 series · 15 of 31 positions shown, 19 images · non-contrast
Comparison: Radiograph from earlier the same day

CLINICAL DATA: Abn CXR. Patient presents from [REDACTED] with shortness of breath, lower extremity edema and 10 pound
weight gain over last 4 months. Admitted with CHF. There is a
documented 02 sat of 91 on 2 liters. Receiving IV Lasix and some
supplemental 02 support. Patient has decreased breath sounds
according to her RN. NKI. No hx CA. Hx Sp pci and stent of lad 5336.

EXAM:
CT CHEST WITHOUT CONTRAST
TECHNIQUE: Multidetector CT imaging of the chest was performed following the
standard protocol without IV contrast.

[Series 3: routine chest wo · axial · 0.61mm/px · z∈[-204,+100]mm · 15 of 67 slices shown, 19 images]
[im 3/67  mediastinal]
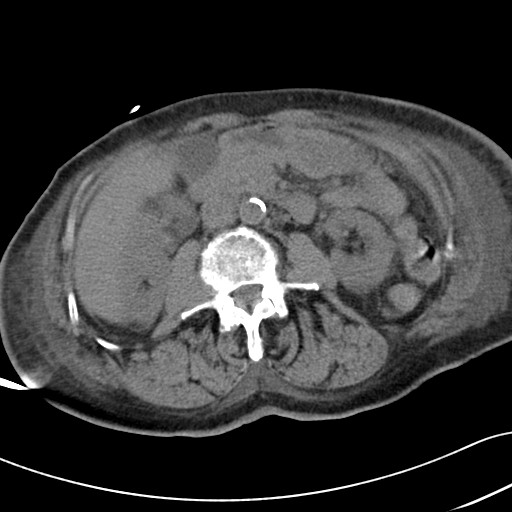
[im 3/67  lung]
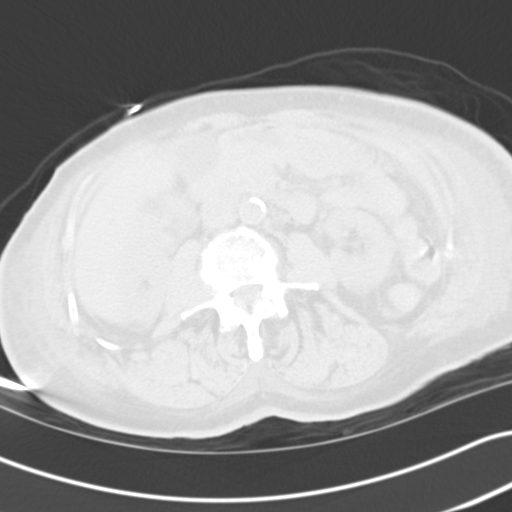
[im 8/67  lung]
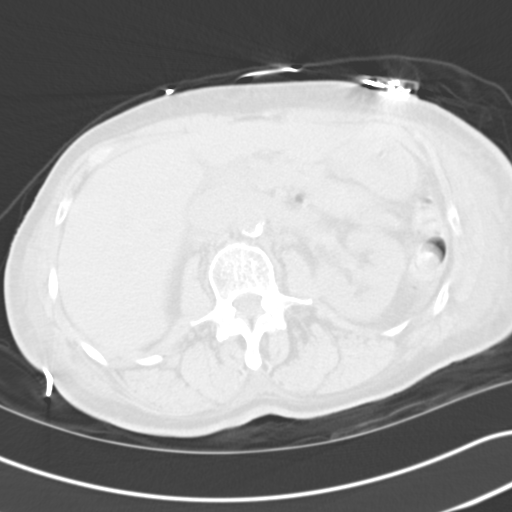
[im 13/67  lung]
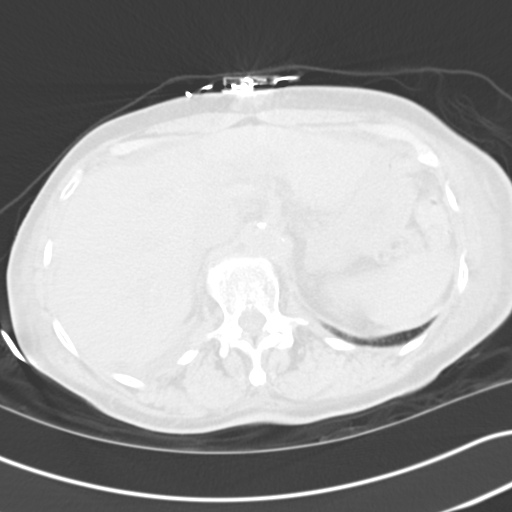
[im 15/67  lung]
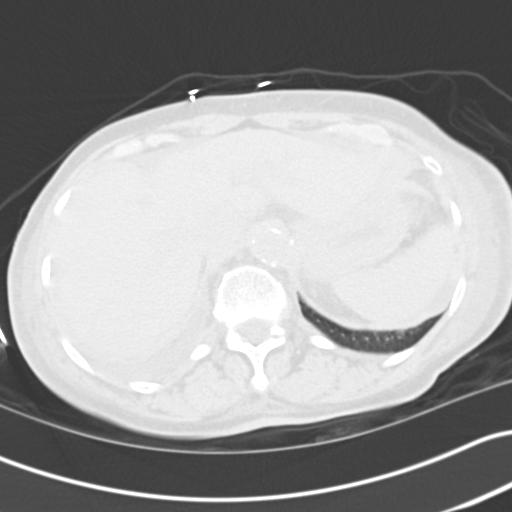
[im 20/67  mediastinal]
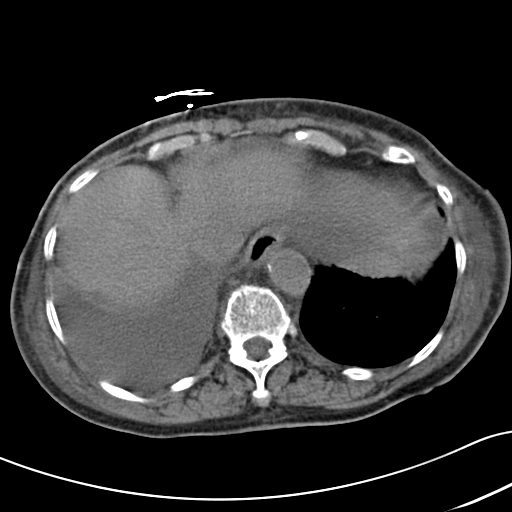
[im 20/67  lung]
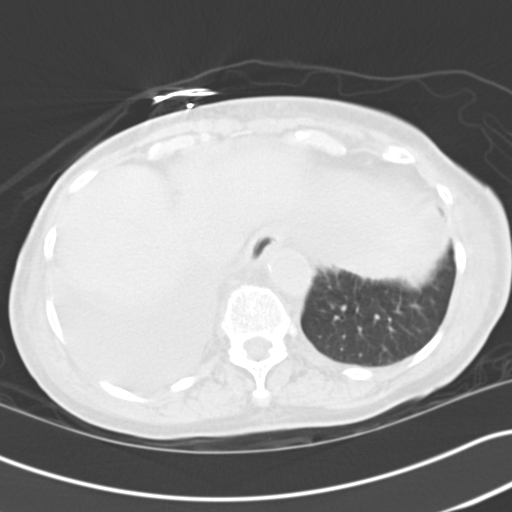
[im 25/67  lung]
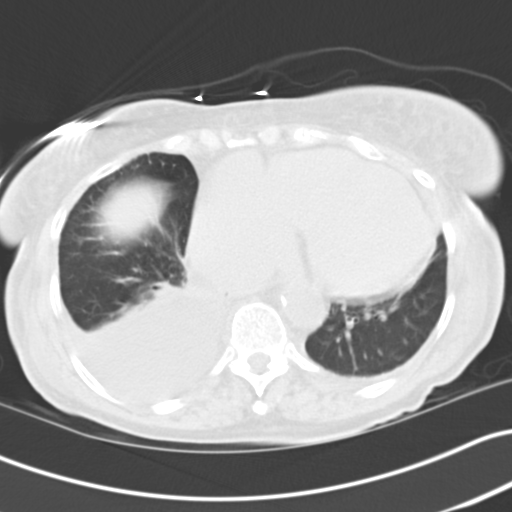
[im 30/67  lung]
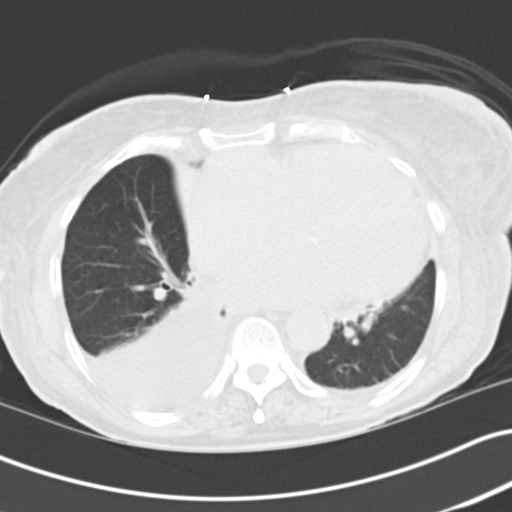
[im 35/67  lung]
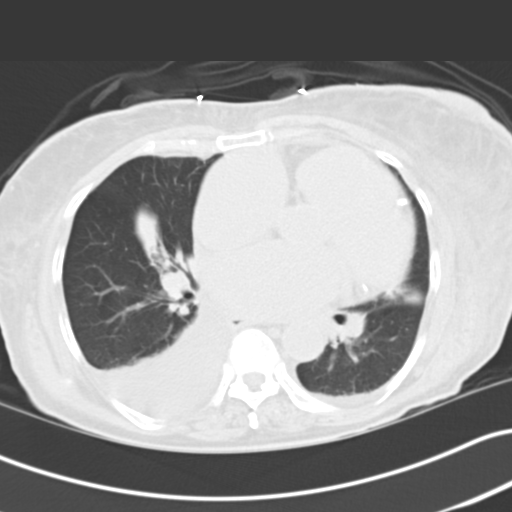
[im 37/67  mediastinal]
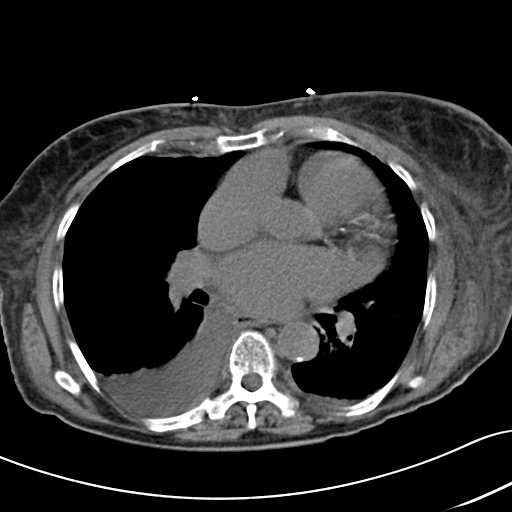
[im 37/67  lung]
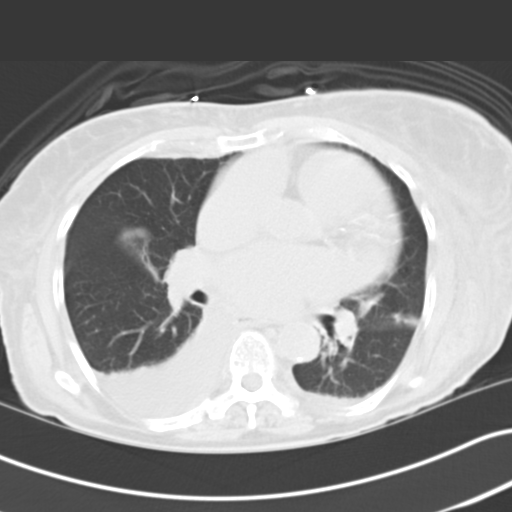
[im 42/67  lung]
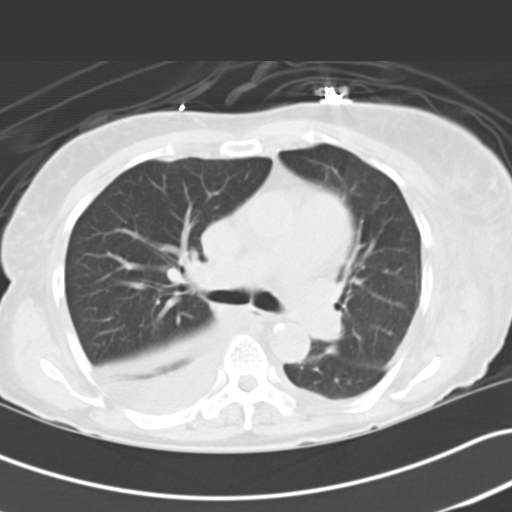
[im 47/67  lung]
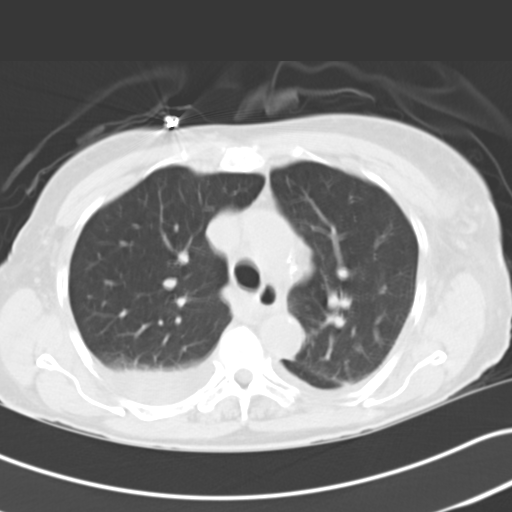
[im 52/67  lung]
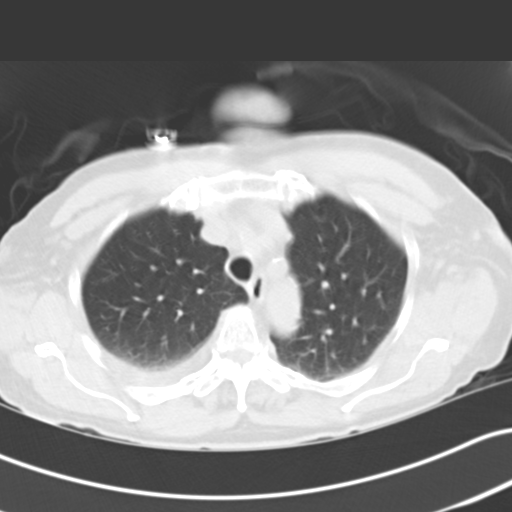
[im 54/67  mediastinal]
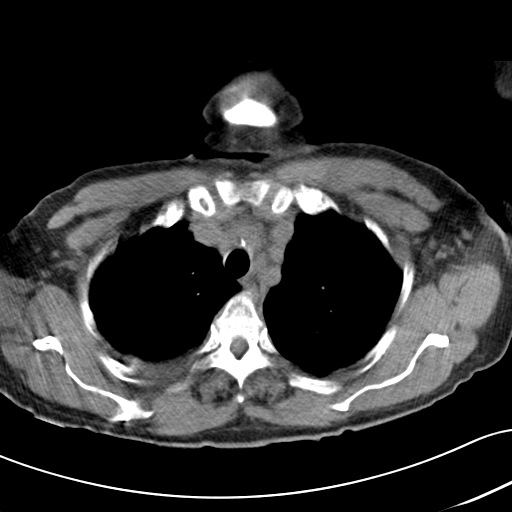
[im 54/67  lung]
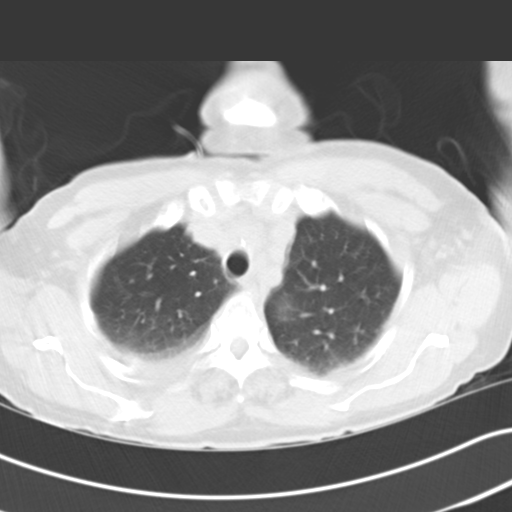
[im 59/67  lung]
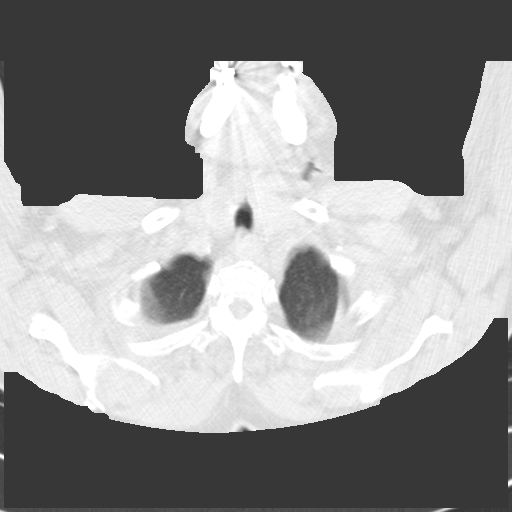
[im 64/67  lung]
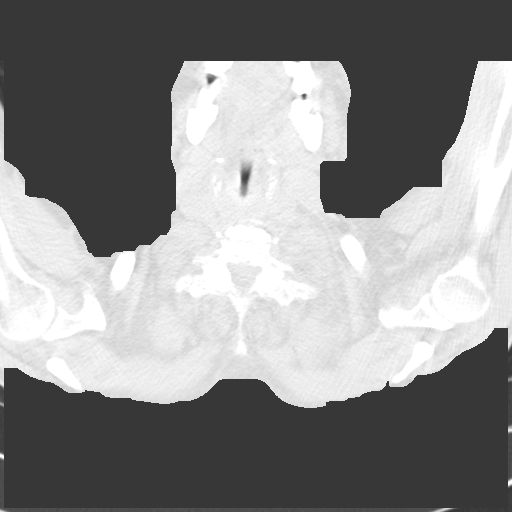

[15 of 31 positions shown; findings below may reference images not displayed]

FINDINGS: Moderate right and trace left pleural effusions. No pericardial
effusion. Four-chamber cardiac enlargement. Coronary and aortic
calcifications. No definite hilar or mediastinal adenopathy,
sensitivity decreased without IV contrast.

Dependent atelectasis in the right lower lobe. Right middle lobe
atelectasis and volume loss. No evident central obstructing mass or
lesion. Calcified granuloma, anterior right lower lobe.

Minimal thoracic spondylitic changes. Sternum intact. Dental
restorations and caries. Probable 14 mm cyst in hepatic segment 4,
incompletely characterized. Remainder visualized upper abdomen
grossly unremarkable.
IMPRESSION: 1. Right middle lobe atelectasis.
2. Moderate right and trace left pleural effusions.
3. Atherosclerosis, including aortic and coronary artery disease.
Please note that although the presence of coronary artery calcium
documents the presence of coronary artery disease, the severity of
this disease and any potential stenosis cannot be assessed on this
non-gated CT examination. Assessment for potential risk factor
modification, dietary therapy or pharmacologic therapy may be
warranted, if clinically indicated.
